# Patient Record
Sex: Female | Born: 1995 | Race: White | Hispanic: No | Marital: Single | Smoking: Never smoker
Health system: Southern US, Academic
[De-identification: ages and names within clinical notes are randomized; demographics above are authoritative.]

## PROBLEM LIST (undated history)

## (undated) DIAGNOSIS — A64 Unspecified sexually transmitted disease: Secondary | ICD-10-CM

## (undated) DIAGNOSIS — G47419 Narcolepsy without cataplexy: Secondary | ICD-10-CM

## (undated) HISTORY — DX: Narcolepsy without cataplexy: G47.419

## (undated) HISTORY — PX: HX WISDOM TEETH EXTRACTION: SHX21

## (undated) HISTORY — PX: HX NO SURGICAL PROCEDURES: 2100001501

## (undated) HISTORY — DX: Unspecified sexually transmitted disease: A64

---
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
## 2017-05-22 ENCOUNTER — Ambulatory Visit (INDEPENDENT_AMBULATORY_CARE_PROVIDER_SITE_OTHER): Payer: Self-pay | Admitting: Family

## 2017-09-24 ENCOUNTER — Ambulatory Visit (INDEPENDENT_AMBULATORY_CARE_PROVIDER_SITE_OTHER): Payer: BC Managed Care – PPO

## 2017-09-24 ENCOUNTER — Encounter (FREE_STANDING_LABORATORY_FACILITY)
Admit: 2017-09-24 | Discharge: 2017-09-24 | Disposition: A | Discharge status: Home / Self Care | Payer: BC Managed Care – PPO | Attending: Emergency Medicine | Admitting: Emergency Medicine

## 2017-09-24 ENCOUNTER — Encounter (INDEPENDENT_AMBULATORY_CARE_PROVIDER_SITE_OTHER): Payer: Self-pay

## 2017-09-24 VITALS — BP 121/76 | HR 79 | Temp 98.5°F | Resp 16 | Ht 62.0 in | Wt 125.0 lb

## 2017-09-24 DIAGNOSIS — Z113 Encounter for screening for infections with a predominantly sexual mode of transmission: Secondary | ICD-10-CM | POA: Insufficient documentation

## 2017-09-24 DIAGNOSIS — Z3202 Encounter for pregnancy test, result negative: Secondary | ICD-10-CM

## 2017-09-24 DIAGNOSIS — R198 Other specified symptoms and signs involving the digestive system and abdomen: Principal | ICD-10-CM

## 2017-09-24 DIAGNOSIS — R109 Unspecified abdominal pain: Secondary | ICD-10-CM

## 2017-09-24 DIAGNOSIS — Z6822 Body mass index (BMI) 22.0-22.9, adult: Secondary | ICD-10-CM

## 2017-09-24 NOTE — Patient Instructions (Addendum)
[place]     Operated by [facility]  [place], [place] [address]  Phone: [phone]  Fax: [phone]  [email or web address]  Twitter [email or web address]   Closed all  Holidays        Attending Caregiver: [facility]    Today's orders:   Orders Placed This Encounter   . LACTOFERRIN   . NEISSERIA GONORRHOEAE DNA BY PCR   . CHLAMYDIA TRACHOMITIS DNA BY PCR (INHOUSE)   . CALPROTECTIN, FECES   . POCT URINE PREGNANCY         Prescription(s) E-Rx to:  [id number] [place], [place]    Future appts with [facility]  Visit date not found    [place]. THIS IS ONE EASY WAY TO COMMUNICATE WITH [facility].  SEE THE CODE ON THIS FORM FOR ACCESS.    -----------------------------PRIVACY INFORMATION-----------------------------------------------  As a [place] student, regardless of your age, we cannot discuss your personal health information with a parent, spouse, family member or anyone else without your expressed consent.    This policy does not include:  Individuals who would have a legitimate reason to access your records and information to assist in your care under the provisions of HIPAA ([place] and [place]) law;   Individuals with whom you have previously given expressed written consent to do so, such a legal guardian or Power of Attorney.    No one can access your My[facility], unless you give them your account sign on and password. You will receive emails from [place].  This means anyone who has access to the email account you provided can see this notification. There will be no private medical information included in these emails. This notification of  new medical information  available in your My[facility]Chart, may be information that you do not want others to know.   _______________________________________________________________________    If your  symptoms persist, worsen or you develop any new or concerning symptoms please call [place] for at [phone] for a follow up appointment.   If your symptoms are severe, go immediately to the emergency department or call 911.  Please check with your health insurance coverage for these types of visits.   Please keep all follow up visit recommended at today's visit.    If you received x-rays during your visit, be aware that the final and formal interpretation of those films by a radiologist will occur after your discharge.  If there is a significant discrepancy identified after your discharge, we will contact you at the telephone number provided during registration.  These results are available for your review on [facility].    Please refer to your My[facility] for lab test results. Lab test results related to HIV, STI's and pregnancy are considered private and are therefore not immediately visible in your My[facility]Chart, we may still be able to send a generic MyChart message for these results.  If you have cultures pending for sexually transmitted diseases, you will be contacted by phone.     Positive cultures are reported to the [place] Department of Health, as required by state law. These results are considered private on My[facility] and can only be released by the provider.We will not contact you if the results are negative.    It is very important that we have a phone number that is the single best way to contact you in the event that we become aware of important clinical  information or concerns after your discharge.  If the phone number you provided at registration is NOT this number you should inform staff and registration prior to leaving.     Please call [place] at [phone] with any further questions.    Instructions discussed with patient upon discharge by clinical staff with all questions answered.    Starlyn Skeans, MD 09/24/2017, 18:13        What is Irritable Bowel Syndrome (IBS)?      Muscle contraction moves food through the digestive tract.      People who have irritable bowel syndrome (IBS) have digestive tracts that react abnormally to certain substances or to stress. This leads to symptoms like cramps, gas, bloating, pain, constipation, and diarrhea. Sometimes called "spastic colon," IBS is a common condition that is not a disease, but rather a group of symptoms that happen together.  IBS--a motility problem  The muscle movement that passes food through the digestive tract is called motility. When you have IBS, the normal motility of the digestive tract (especially the colon) is disrupted. Motility may speed up, slow down, or become irregular. If stool passes too quickly through the colon, not enough water is absorbed from it. Loose, watery stools (diarrhea) can result. If stool passes through the colon too slowly, too much water is absorbed and the stool becomes hard and dry (constipation). Also, stool and gas may back up and cause painful pressure and cramping. There is no single test that can diagnose IBS. It is a group of symptoms that help your healthcare provider with the diagnosis. Often multiple blood, stool, radiologic tests, or even colonoscopy are performed in the evaluation of people suspected to have IBS. These are done primarily to make sure that there are no other illnesses that can account for your symptoms.  What causes IBS?  A great deal of research has been done on IBS, but the cause is still not known. Some of the possible factors include:   Smoking, eating certain foods, or drinking alcohol or caffeinated drinks can cause, or "trigger," symptoms of IBS.   Although no one knows for sure, IBS may be caused by a problem with the nerves or muscles in your digestive tract.   There is also some evidence that certain bacteria found after a severe gastroinstestinal infectionin the small intestine and colon may cause IBS.   While stress and anxiety worsen the symptoms of  IBS, it is not believed to be the cause.  What you can do  Recommendations include:   Certain medicines may help regulate the working of your digestive tract. Your healthcare provider may prescribe one or more for you.   Medicine can't cure IBS, butit may help manage the symptoms.   Because some medicines may make IBS worse, don't take any medicine, especially laxatives, unless your healthcare provider prescribes it for you.   Your healthcare provider may suggest some lifestyle changes to help control your IBS. Two of the most important are changing your diet and managing stress. If diet changes are suggested, ask for nutritional guidance from a dietitian so you maintain a healthy nutritional balance in your food intake.  Date Last Reviewed: 03/23/2015   2000-2018 [employer]. [address] [place] [address]. All rights reserved. This information is not intended as a substitute for professional medical care. Always follow your healthcare professional's instructions.

## 2017-09-24 NOTE — Progress Notes (Signed)
History of Present Illness: Barbara Graves is a 22 y.o. female who presents to the [facility] today with chief complaint of    Chief Complaint            Nausea After eating.  Mucous in stool.  Symptoms for 4-5 months.  Has diarrhea and constipation at times.         Pt [facility] to [facility] with c/o nausea onset 4-5 months. She reports that she has had random episodes of diarrhea. Pt sts she has had irregular frequency of bowel movements. She reports her nausea is present after eating. Pt sts she has the sensation of vomiting when she is nauseated. She says she has occasional bloating and describes her bowels as "gurgly". Pt reports she has not taken any new medications. She sts she has no Hx of inflammatory bowel diseases. Pt reports she has been unable to eat a lot. She sts she has not had a major change in her diet recently and says she mainly eats pizza and carbohydrates. Pt reports she gets frequent tension headaches. She associates mucus in stool, diarrhea, and bloating. Pt denies abdominal pain and cramps.    Pt also presents with c/o request for STI screening. She reports she is asymptomatic.    Patient denies   new onset of dysuria and genital lesions and vaginal discharge  Patient denies  exposure to STI  Known exposure to no known diseases.   Patient has history of  no prior STI's..   Last sexual contact 2Weeks ago with 1 partners.  Was this contact consensual? yes   Contact protected: no   Condom use: 100%-75%of the time.   Sexual partners in last 60 days: 1  Lifetime sexual partners: Less than 10  Gender of current/past partners Female.  Contraception method: oral contraceptives     Functional Health Screening:    Patient is under 18:  No  Have you had a recent unexplained weight loss or gain?:  No  Because we are aware of abuse and domestic violence today, we ask all patients: Are you being hurt, hit, or frightened by anyone at your home or in your life?:  No  Do you have any basic needs  within your home that are not being met? (such as Food, Shelter, Games developer, Transportation):  No  Patient is under 18 and therefore has no Advance Directives:  No  Patient has:  No Advance  Patient has Advance Directive:  No  Patient offered:  Refused Packet       I reviewed and confirmed the patient's past medical history taken by the nurse or medical assistant with the addition of the following:    Past Medical History:    History reviewed. No pertinent past medical history.    Past Surgical History:    History reviewed. No past surgical history pertinent negatives.    Allergies:  Allergies no known allergies     Medications:    Current Outpatient Medications   Medication Sig   . oral contraceptive (PATIENT'S OWN SUPPLY) Take 1 Tab by mouth Once a day     Social History:    Social History     Tobacco Use   . Smoking status: Never Smoker   . Smokeless tobacco: Never Used   Substance Use Topics   . Alcohol use: Never     Frequency: Never   . Drug use: Never     Family History: No significant family history.  Family Medical History:  Problem Relation (Age of Onset)    No Known Problems Mother, Father, Maternal Grandmother, Maternal Grandfather, Paternal Grandmother, Paternal Grandfather        Review of Systems:    Gastrointestinal: Nausea, mucus in stool, diarrhea, bloating, no abdominal pain, and no cramps  All other review of systems were negative    Physical Exam:  Vital signs:   Vitals:    09/24/17 1756   BP: 121/76   Pulse: 79   Resp: 16   Temp: 36.9 C (98.5 F)   TempSrc: Thermal Scan   SpO2: 100%   Weight: 56.7 kg (125 lb)   Height: 1.575 m ('5\' 2"' )   BMI: 22.91     Facility age limit for growth percentiles is 20 years.    Body mass index is 22.86 kg/m. Facility age limit for growth percentiles is 20 years.  Patient's last menstrual period was 09/03/2017 (approximate).    General:  Well appearing and no acute distress  Head:  Normocephalic  Eyes:  Normal lids/lashes, normal conjunctiva and PERRL  ENT:   Normal EAC's, normal TM's, MMM, normal pharynx/tonsils, normal tongue/uvula and no sinus tenderness to palpation  Neck:  Supple  Pulmonary:  Clear to auscultation bilaterally, no wheezes, no rales and no rhonchi  Cardiovascular:  Regular rate/rhythm, normal S1/S2, no murmur/rub/gallop and distal pulses of bilateral upper extremities intact  Gastrointestinal:  Non-distended, soft, non-tender and no guarding  Skin:  Warm/dry and no rash  Psychiatric:  Appropriate affect and behavior  Neurologic:   Alert and oriented x 3    Data Reviewed:    Point-of-care testing:     Urine Pregnancy: Negative                   and Labs: Gonorrhea and chlamydia screening and lactoferrin and calprotectin testing    Course: Condition at discharge: Good     Differential Diagnosis: IBS vs IBD vs STI screening    Assessment:   1. Alternating constipation and diarrhea    2. Abdominal discomfort    3. Routine screening for STI (sexually transmitted infection)      Plan:    Orders Placed This Encounter   . LACTOFERRIN   . NEISSERIA GONORRHOEAE DNA BY PCR   . CHLAMYDIA TRACHOMITIS DNA BY PCR (INHOUSE)   . CALPROTECTIN, FECES   . 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  Pending lactoferrin and calprotectin testing, if negative then inflammatory bowel ruled out.  Advised to stay hydrated, increase fiber intake, and improving diet.  Advised food diary.  Patient requests STI screening for gonorrhea and chlamydia.    Go to Emergency Department immediately for further work up if any concerning symptoms.  Plan was discussed and patient verbalized understanding.  If symptoms are worsening or not improving the patient should return to the [facility] for further evaluation.    I am scribing for, and in the presence of, Dr. Malena Catholic for services provided on 09/24/2017.  Celene Squibb, SCRIBE   [place], [place] 09/24/2017, 18:00    I personally performed the services described in this documentation, as scribed  in my  presence, and it is both accurate  and complete.    Starlyn Skeans, MD

## 2017-09-25 ENCOUNTER — Encounter (FREE_STANDING_LABORATORY_FACILITY)
Admit: 2017-09-25 | Discharge: 2017-09-25 | Disposition: A | Discharge status: Home / Self Care | Payer: BC Managed Care – PPO | Attending: Emergency Medicine | Admitting: Emergency Medicine

## 2017-09-25 DIAGNOSIS — R109 Unspecified abdominal pain: Secondary | ICD-10-CM | POA: Insufficient documentation

## 2017-09-25 DIAGNOSIS — R198 Other specified symptoms and signs involving the digestive system and abdomen: Secondary | ICD-10-CM | POA: Insufficient documentation

## 2017-09-25 LAB — CHLAMYDIA TRACHOMITIS DNA BY PCR (INHOUSE): CHLAMYDIA TRACHOMATIS PCR: NOT DETECTED

## 2017-09-25 LAB — NEISSERIA GONORRHOEAE DNA BY PCR: NEISSERIA GONORRHOEAE PCR: NOT DETECTED

## 2017-09-28 LAB — LACTOFERRIN: LACTOFERRIN: NEGATIVE

## 2017-09-29 LAB — CALPROTECTIN, FECES
CALPROTECTIN, FECES: <15.6 mcg/g
CALPROTECTIN, FECES: <15.6 mcg/g

## 2017-10-08 ENCOUNTER — Encounter (INDEPENDENT_AMBULATORY_CARE_PROVIDER_SITE_OTHER): Payer: Self-pay | Admitting: Family Medicine

## 2017-11-26 ENCOUNTER — Encounter (INDEPENDENT_AMBULATORY_CARE_PROVIDER_SITE_OTHER): Payer: Self-pay

## 2017-11-26 ENCOUNTER — Ambulatory Visit (INDEPENDENT_AMBULATORY_CARE_PROVIDER_SITE_OTHER): Payer: BC Managed Care – PPO

## 2017-11-26 VITALS — BP 125/90 | HR 100 | Temp 98.4°F | Resp 18 | Ht 62.0 in | Wt 122.0 lb

## 2017-11-26 DIAGNOSIS — R05 Cough: Secondary | ICD-10-CM

## 2017-11-26 DIAGNOSIS — J329 Chronic sinusitis, unspecified: Principal | ICD-10-CM

## 2017-11-26 DIAGNOSIS — R059 Cough, unspecified: Secondary | ICD-10-CM

## 2017-11-26 DIAGNOSIS — Z6822 Body mass index (BMI) 22.0-22.9, adult: Secondary | ICD-10-CM

## 2017-11-26 MED ORDER — AMOXICILLIN 875 MG-POTASSIUM CLAVULANATE 125 MG TABLET: 1 | Tab | Freq: Two times a day (BID) | ORAL | 0 refills | 0 days | Status: AC

## 2017-11-26 NOTE — Patient Instructions (Signed)
Sinusitis (Antibiotic Treatment)    The sinuses are air-filled spaces within the bones of the face. They connect to the inside of the nose.Sinusitisis an inflammation of the tissue that lines the sinuses. Sinusitis can occur during a cold. It can also happen due to allergies to pollens and other particles in the air. Sinusitis can cause symptoms of sinus congestion and a feeling of fullness. A sinus infection causes fever, headache, and facial pain. There is often green or yellow fluid draining from the nose or into the back of the throat (post-nasal drip). You have been given antibiotics to treat this condition.  Home care   Take the full course of antibiotics as instructed. Do not stop taking them, even when you feel better.   Drink plenty of water, hot tea, and other liquids. This may help thin nasal mucus. It also may help your sinuses drain fluids.   Heat may help soothe painful areas of your face. Use a towel soaked in hot water. Or, stand in the shower and direct the warm spray onto your face. Using a vaporizer along with a menthol rub at night may also help soothe symptoms.   Anexpectorantwith guaifenesin may help thin nasal mucus and help your sinuses drain fluids.   You can use an over-the-counterdecongestant,unless a similar medicine was prescribed to you. Nasal sprays work the fastest. Use one that contains phenylephrine or oxymetazoline. First blow your nose gently. Then use the spray. Do not use these medicines more often than directed on the label. If you do, your symptoms may get worse. You may also take pills that contain pseudoephedrine. Don't use products that combine multiple medicines. This is because side effects may be increased. Read labels. You can also ask the pharmacist for help. (People with high blood pressure should not use decongestants. They can raise blood pressure.)   Over-the-counterantihistaminesmay help if allergies contributed to your sinusitis.    Do not use  nasal rinses or irrigation during an acute sinus infection, unless your healthcare provider tells you to. Rinsing may spread the infection to other areas in your sinuses.   Use acetaminophen or ibuprofen to control pain, unless another pain medicine was prescribed to you. If you have chronic liver or kidney disease or ever had a stomach ulcer, talk with your healthcare provider before using these medicines. (Aspirin should never be taken by anyone under age 18 who is ill with a fever. It may cause severe liver damage.)   Don't smoke. This can make symptoms worse.  Follow-up care  Follow up with your healthcare provider or our staff if you are not better in 1 week.  When to seek medical advice  Call your healthcare provider if any of these occur:   Facial pain or headache that gets worse   Stiff neck   Unusual drowsiness or confusion   Swelling of your forehead or eyelids   Vision problems, such as blurred or double vision   Fever of100.4F (38C)or higher, or as directed by your healthcare provider   Seizure   Breathing problems   Symptoms don't go away in 10 days  Prevention  Here are steps you can take to help prevent an infection:   Keep good hand washing habits.   Don't have close contact with people who have sore throats, colds, or other upper respiratory infections.   Don't smoke, and stay away from secondhand smoke.   Stay up to date with of your vaccines.  Date Last Reviewed: 07/23/2016     2000-2018 [employer]. [address], [place] [address]. All rights reserved. This information is not intended as a substitute for professional medical care. Always follow your healthcare professional's instructions.      [place]     Operated by [facility]  [place], [place] [address]  Phone: [phone]  Fax: [phone]  [email or web address]  Twitter [email or web address]   Closed all  Holidays        Attending  Caregiver: [facility]    Today's orders: No orders of the defined types were placed in this encounter.        Prescription(s) E-Rx to:  [id number] [place], [place]    Future appts with [facility]  Visit date not found    [place]. THIS IS ONE EASY WAY TO COMMUNICATE WITH [facility].  SEE THE CODE ON THIS FORM FOR ACCESS.    -----------------------------PRIVACY INFORMATION-----------------------------------------------  As a [place] student, regardless of your age, we cannot discuss your personal health information with a parent, spouse, family member or anyone else without your expressed consent.    This policy does not include:  Individuals who would have a legitimate reason to access your records and information to assist in your care under the provisions of HIPAA ([place] and [place]) law;   Individuals with whom you have previously given expressed written consent to do so, such a legal guardian or Power of Attorney.    No one can access your My[facility]Chart, unless you give them your account sign on and password. You will receive emails from [place].  This means anyone who has access to the email account you provided can see this notification. There will be no private medical information included in these emails. This notification of  new medical information  available in your My[facility]Chart, may be information that you do not want others to know.   _______________________________________________________________________    If your symptoms persist, worsen or you develop any new or concerning symptoms please call [place] for at [phone] for a follow up appointment.   If your symptoms are severe, go immediately to the emergency department or call 911.  Please check with your health insurance coverage for these types of visits.   Please keep all follow up visit recommended at today's visit.    If you  received x-rays during your visit, be aware that the final and formal interpretation of those films by a radiologist will occur after your discharge.  If there is a significant discrepancy identified after your discharge, we will contact you at the telephone number provided during registration.  These results are available for your review on My[facility].    Please refer to your My[facility]Chart for lab test results. Lab test results related to HIV, STI's and pregnancy are considered private and are therefore not immediately visible in your My[facility]Chart, we may still be able to send a generic MyChart message for these results.  If you have cultures pending for sexually transmitted diseases, you will be contacted by phone.     Positive cultures are reported to the [place] Department of Health, as required by state law. These results are considered private on My[facility]Chart and can only be released by the provider.We will not contact you if the results are negative.    It is very important that we have a phone number that is the single best way to contact  you in the event that we become aware of important clinical information or concerns after your discharge.  If the phone number you provided at registration is NOT this number you should inform staff and registration prior to leaving.     Please call [place] at [phone] with any further questions.    Instructions discussed with patient upon discharge by clinical staff with all questions answered.    Nyna Chilton, PA-C 11/26/2017, 17:24

## 2017-11-26 NOTE — Progress Notes (Addendum)
Attending physician: Dr. Malena Catholic  History of Present Illness: Barbara Graves is a 22 y.o. female who presents to the [facility] today with chief complaint of    Chief Complaint            Sinus Infection x1 week    Chest Congestion         Pt presents to [facility] with c/o productive and dry cough onset 2 weeks ago. She reports that her sxs started as cold sxs and she took OTC sudafed which alleviated her sxs but then about 1 week ago her sxs worsened again. Pt sts that she is currently on birth control. She says she has chest pain that is mainly present after coughing but is occasionally sharp. Pt reports that she has no known drug allergies. She associates sinus congestion, chest tightness, fever, fatigue, sinus pressure, and sneezing.    Functional Health Screening:    Patient is under 18:  No  Have you had a recent unexplained weight loss or gain?:  No  Because we are aware of abuse and domestic violence today, we ask all patients: Are you being hurt, hit, or frightened by anyone at your home or in your life?:  No  Do you have any basic needs within your home that are not being met? (such as Food, Shelter, Games developer, Transportation):  No  Patient is under 18 and therefore has no Advance Directives:  No  Patient has:  No Advance  Patient has Advance Directive:  No  Patient offered:  Refused Packet       I reviewed and confirmed the patient's past medical history taken by the nurse or medical assistant with the addition of the following:    Past Medical History:    History reviewed. No pertinent past medical history.    Past Surgical History:    Past Surgical History:   Procedure Laterality Date   . HX NO SURGICAL PROCEDURES       Allergies:  No Known Allergies     Medications:    Current Outpatient Medications   Medication Sig   . amoxicillin-pot clavulanate (AUGMENTIN) 875-125 mg Oral Tablet Take 1 Tab by mouth Twice daily for 7 days   . oral contraceptive (PATIENT'S OWN SUPPLY) Take 1 Tab by  mouth Once a day     Social History:    Social History     Tobacco Use   . Smoking status: Never Smoker   . Smokeless tobacco: Never Used   Substance Use Topics   . Alcohol use: Never     Frequency: Never   . Drug use: Never     Family History: No significant family history.  Family Medical History:     Problem Relation (Age of Onset)    No Known Problems Mother, Father, Maternal Grandmother, Maternal Grandfather, Paternal Grandmother, Paternal Grandfather        Review of Systems:    General: Fever and fatigue  ENT: Sinus congestion, sinus congestion, and sneezing  Pulmonary: Productive and dry cough and chest congestion  All other review of systems were negative    Physical Exam:  Vital signs:   Vitals:    11/26/17 1711   BP: 125/90   Pulse: 100   Resp: 18   Temp: 36.9 C (98.4 F)   TempSrc: Thermal Scan   SpO2: 98%   Weight: 55.3 kg (122 lb)   Height: 1.575 m ('5\' 2"' )   BMI: 22.36     Facility  age limit for growth percentiles is 20 years.    Body mass index is 22.31 kg/m. Facility age limit for growth percentiles is 20 years.  Patient's last menstrual period was 11/16/2017 (approximate).    General:  Well appearing and no acute distress  Head:  Normocephalic  Eyes:  Normal lids/lashes and normal conjunctiva  ENT:  Normal EAC's, normal TM's, MMM, normal pharynx/tonsils and normal tongue/uvula, TTP of the frontal and maxillary sinuses bilaterally, TTP of the anterior chest wall and ribs  Neck:  Supple  Pulmonary:  Clear to auscultation bilaterally, no wheezes, no rales and no rhonchi  Cardiovascular:  Regular rate/rhythm, normal S1/S2, and no murmur/rub/gallop  Skin:  Warm/dry and no rash  Psychiatric:  Appropriate affect and behavior  Neurologic:  Alert and oriented x 3  Hem/Lymph:  No cervical lymphadenopathy     Data Reviewed:    Not applicable    Course: Condition at discharge: Good     Differential Diagnosis: Sinusitis vs Bronchitis vs URI    Assessment:   1. Sinusitis, unspecified chronicity, unspecified  location    2. Cough      Plan:    Orders Placed This Encounter   . amoxicillin-pot clavulanate (AUGMENTIN) 875-125 mg Oral Tablet     Take Augmentin as prescribed.  Advised to take OTC Sudafed and Flonase for additional symptomatic relief.  Follow up if chest pain, SOB, or fever worsens for reevaluation.    Go to Emergency Department immediately for further work up if any concerning symptoms.  Plan was discussed and patient verbalized understanding. If symptoms are worsening or not improving the patient should return to the [facility] for further evaluation.    I am scribing for, and in the presence of, Kirstin Humphrey, PA-C, for services provided on 11/26/2017.  Celene Squibb, [place], [place] 11/26/2017, 17:57     The supervising physician was physically present and available for consultation, and did not physically see the patient.  I personally performed the services described in this documentation, as scribed  in my presence, and it is both accurate  and complete.    Kirstin Humphrey, [employer]

## 2018-03-08 ENCOUNTER — Ambulatory Visit (INDEPENDENT_AMBULATORY_CARE_PROVIDER_SITE_OTHER): Payer: BC Managed Care – PPO

## 2018-03-08 ENCOUNTER — Encounter (INDEPENDENT_AMBULATORY_CARE_PROVIDER_SITE_OTHER): Payer: Self-pay

## 2018-03-08 VITALS — BP 137/72 | HR 80 | Temp 97.8°F | Resp 14 | Ht 62.0 in | Wt 119.5 lb

## 2018-03-08 DIAGNOSIS — J029 Acute pharyngitis, unspecified: Secondary | ICD-10-CM

## 2018-03-08 DIAGNOSIS — H6593 Unspecified nonsuppurative otitis media, bilateral: Secondary | ICD-10-CM

## 2018-03-08 DIAGNOSIS — Z6821 Body mass index (BMI) 21.0-21.9, adult: Secondary | ICD-10-CM

## 2018-03-08 MED ORDER — PREDNISONE 5 MG TABLET: Tab | ORAL | 0 refills | 0 days | Status: DC

## 2018-03-08 MED ORDER — AZELASTINE 137 MCG (0.1 %) NASAL SPRAY AEROSOL
1.00 | INHALATION_SPRAY | Freq: Two times a day (BID) | NASAL | 0 refills | Status: AC | PRN
Start: 2018-03-08 — End: 2018-03-22

## 2018-03-08 NOTE — Patient Instructions (Addendum)
[facility]      Operated by [facility]  [address] [place], [place] [address]  Phone: [phone])  Fax: [phone]  [email or web address]  Open Daily 8:00a - 8:00p    Closed Thanksgiving and Christmas Day  [facility]     Operated by [facility]  [place], [place] [address]  Phone: [phone])  Fax: [phone]  [email or web address]  Open M-F  8:00a - 8:00p  Sat              10:00a - 4:00p  Sun             Closed  Closed all [place] Holidays        Attending Caregiver: Aura Fey, MD      Today's orders:   Orders Placed This Encounter   . POCT RAPID STREP A   . predniSONE (DELTASONE) 5 mg Oral Tablet   . azelastine (ASTELIN) 137 mcg (0.1 %) Nasal Aerosol, Spray        Prescription(s) E-Rx to:  [facility] [place], [address]    ________________________________________________________________________  Short Term Disability and [facility] does NOT provide assistance with any disability applications.  If you feel your medical condition requires you to be on disability, you will need to follow up with  your primary care physician or a specialist.  We apologize for any inconvenience.    For Medication Prescribed by [facility]:  As an Urgent Care facility, our clinic does NOT offer prescription refills over the telephone.    If you need more of the medication one of our medical providers prescribed, you will  either need to be re-evaluated by [place] or see your primary care physician.    ________________________________________________________________________      It is very important that we have a phone number.  This is the single best way to contact you in the event that we become aware of important clinical information or concerns after your discharge.  If the phone number you provided at  registration is NOT this number you should inform staff and registration prior to leaving.      Your treatment and evaluation today was focused on identifying and treating potentially emergent conditions based on your presenting signs, symptoms, and history.  The resulting initial clinical impression and treatment plan is not intended to be definitive or a substitute for a full physical examination and evaluation by your primary care provider.  If your symptoms persist, worsen, or you develop any new or concerning symptoms, you need to be evaluated.      If you received x-rays during your visit, be aware that the final and formal interpretation of those films by a radiologist may occur after your discharge.  If there is a significant discrepancy identified after your discharge, we will contact you at the telephone number provided at registration.      If you received a pelvic exam, you may have cultures pending for sexually transmitted diseases.  Positive cultures are reported to the [place] Department of Health as required by state law.  You may contact the Health Information Management Office of [facility] to get a copy of your results.     If you are over 45 year old, we cannot discuss your personal health information with a parent, spouse, family member, or  anyone else without your consent.  This does not include those who have legitimate access to your records and information to assist in your care under the provisions of HIPAA ([place]) law, or those to whom you have previously given written consent to do so, such a legal guardian or Power of [place].      Instructions are discussed with patient upon discharge by clinical staff with all questions answered.  Please call [facility] [phone]) if any further questions develop.  Go immediately to the emergency department if any concerns or worsening symptoms.      Aura Fey, MD 03/08/2018, 17:07          Viral Upper Respiratory Illness (Adult)    You have a viral upper respiratory illness (URI), which is another term for the common cold. This illness is contagious during the first few days. It is spread through the air by coughing and sneezing. It may also be spread by direct contact (touching the sick person and then touching your own eyes, nose, or mouth). Frequent handwashing will decrease risk of spread. Most viral illnesses go away within 7 to 10 days with rest and simple home remedies. Sometimes the illness may last for several weeks. Antibiotics will not kill a virus, and they are generally not prescribed for this condition.  Home care   If symptoms are severe, rest at home for the first 2 to 3 days. When you resume activity, don't let yourself get too tired.   Don't smoke. If you need help stopping, talk with your healthcare provider.   Avoid being exposed to cigarette smoke (yours or others').   You may use acetaminophen or ibuprofen to control pain and fever, unless another medicine was prescribed.If you have chronic liver or kidney disease, have ever had a stomach ulcer or gastrointestinal bleeding, or are taking blood-thinning medicines, talk with your healthcare provider before using these medicines. Aspirin should never be given to anyone under 62 years of age who is ill with a viral infection or fever. It may cause severe liver or brain damage.   Your appetite may be poor, so a light diet is fine. Stay well hydrated by drinking 6 to 8 glasses of fluids per day (water, soft drinks, juices, tea, or soup). Extra fluids will help loosen secretions in the nose and lungs.   Over-the-counter cold medicines will not shorten the length of time you're sick, but they may be helpful for the following symptoms: cough, sore throat, and nasal and sinus congestion. If you take prescription medicines, ask your healthcare provider or pharmacist which over-the-counter medicines are safe to use. (Note: Don't  use decongestants if you have high blood pressure.)  Follow-up care  Follow up with your healthcare provider, or as advised.  When to seek medical advice  Call your healthcare provider right away if any of these occur:   Cough with lots of colored sputum (mucus)   Severe headache; face, neck, or ear pain   Difficultyswallowingdue to throat pain   Fever of 100.26F (38C) or higher, or as directed by your healthcare provider  Call 911  Call 911 if any of these occur:   Chest pain, shortness of breath, wheezing, or difficulty breathing   Coughing up blood   Very severe pain with swallowing, especially if it goes along with a muffled voice   Date Last Reviewed: 02/20/2017   2000-2019 [employer]. [address] [place] [address].  All rights reserved. This information is not intended as a substitute for professional medical care. Always follow your healthcare professional's instructions.

## 2018-03-08 NOTE — Progress Notes (Signed)
History of Present Illness: Barbara Graves is a 22 y.o. female who presents to the [facility] today with chief complaint of    Chief Complaint            Ear Ache bilateral    Sore Throat         [facility] with c/o sore throat onset yesterday. She sts that for the past month she has been experiencing bilateral otalgia intermittently over the past month. Pt reports that she became concerned this morning when she woke up and noticed blood in her L ear canal. She sts that she is otherwise healthy with no concerning medical history. Pt reports that she is not a smoker. She sts that she just recently ended her period and has no concerns for pregnancy. Pt reports that she does take birth control. She associates nasal congestion, bilateral otalgia, dry cough, SOB, wheezing and swollen lymph glands. Pt denies fever, rhinorrhea, sneezing and itchy/watery eyes.    On average, how many days/week do you engage in moderate to vigorous physical activity (like brisk walking)?: 5 Days  On average, how many minutes do you enage in physical activity at this level?: 30-45 Minutes/day  Total activity days/week x minutes/day =: 225    Functional Health Screening:    Patient is under 18:  No  Have you had a recent unexplained weight loss or gain?:  No  Because we are aware of abuse and domestic violence today, we ask all patients: Are you being hurt, hit, or frightened by anyone at your home or in your life?:  No  Do you have any basic needs within your home that are not being met? (such as Food, Shelter, Games developer, Transportation):  No  Patient is under 18 and therefore has no Advance Directives:  No  Patient has:  No Advance  Patient has Advance Directive:  No  Patient offered:  Refused Packet  Screening unable to be completed:  No       I reviewed and confirmed the patient's past medical history taken by the nurse or medical assistant with the addition of the following:    Past Medical History:    History  reviewed. No pertinent past medical history.    Past Surgical History:    Past Surgical History:   Procedure Laterality Date   . HX NO SURGICAL PROCEDURES       Allergies:  No Known Allergies     Medications:    Current Outpatient Medications   Medication Sig   . azelastine (ASTELIN) 137 mcg (0.1 %) Nasal Aerosol, Spray 1 Spray by INTRANASAL route Twice per day as needed for Rhinitis for up to 14 days Use in each nostril as directed   . oral contraceptive (PATIENT'S OWN SUPPLY) Take 1 Tab by mouth Once a day   . predniSONE (DELTASONE) 5 mg Oral Tablet 4 tabs po x 1 day then 3 tabs po x 1 day then 2 tabs po x 1 day then 1 tab po x 1 day then stop     Social History:    Social History     Tobacco Use   . Smoking status: Never Smoker   . Smokeless tobacco: Never Used   Substance Use Topics   . Alcohol use: Never     Frequency: Never   . Drug use: Never     Family History: No significant family history.  Family Medical History:     Problem Relation (Age of  Onset)    No Known Problems Mother, Father, Maternal Grandmother, Maternal Grandfather, Paternal Grandmother, Paternal Grandfather        Review of Systems:    General: no fever  ENT:  sore throat, bilateral otalgia, nasal congestion, no rhinorrhea and no sneezing  Eyes: no itchy/watery eyes  Pulmonary:   dry cough, wheezing and SOB  Heme/Lymph:  swollen lymph glands  All other review of systems were negative    Physical Exam:  Vital signs:   Vitals:    03/08/18 1645   BP: 137/72   Pulse: 80   Resp: 14   Temp: 36.6 C (97.8 F)   TempSrc: Thermal Scan   SpO2: 98%   Weight: 54.2 kg (119 lb 8 oz)   Height: 1.575 m ('5\' 2"' )   BMI: 21.9     Facility age limit for growth percentiles is 20 years.    Body mass index is 21.86 kg/m. Facility age limit for growth percentiles is 20 years.  Patient's last menstrual period was 02/22/2018.    General:  Well appearing and no acute distress  Head:  Normocephalic  Eyes:  Normal lids/lashes and normal conjunctiva  ENT:  Normal EAC's,  TM's are dull with a small amount of fluid present bilaterally, MMM, mildly erythematous pharynx with cobblestoning appearance and normal tongue/uvula  Neck:  Supple and no thyromegaly  Pulmonary:  Clear to auscultation bilaterally, no wheezes, no rales and no rhonchi  Cardiovascular:  Regular rate/rhythm, normal S1/S2 and no murmur/rub/gallop  Skin:  Warm/dry and no rash  Psychiatric:  Appropriate affect and behavior  Neurologic:   Alert and oriented x 3  Hem/Lymph:  No cervical lymphadenopathy     Data Reviewed:    Point-of-care testing:       Rapid Strep: Negative                      Course: Condition at discharge: Good     Differential Diagnosis: serous otitis vs allergies vs strep pharyngitis     Assessment:   1. Sorethroat    2. Bilateral serous otitis media      Plan:    Orders Placed This Encounter   . POCT RAPID STREP A   . predniSONE (DELTASONE) 5 mg Oral Tablet   . azelastine (ASTELIN) 137 mcg (0.1 %) Nasal Aerosol, Spray         POCT rapid strep obtained in clinic which yielded negative results, discussed this with patient.   Take Prednisone and use Astelin as directed.  Ensure adequate hydration and rest.  Discussed symptomatic management with Tylenol and Ibuprofen.  Supplied excuse for work per patient request.     Go to Emergency Department immediately for further work up if any concerning symptoms.  Plan was discussed and patient verbalized understanding. If symptoms are worsening or not improving the patient should return to the [facility] for further evaluation.    I am scribing for, and in the presence of, Dr. Harlow Mares for services provided on 03/08/2018.  Pleas Patricia, SCRIBE     Adam Gorden Harms, SCRIBE 03/08/2018, 17:01  I personally performed the services described in this documentation, as scribed  in my presence, and it is both accurate  and complete.    Aura Fey, MD

## 2018-03-17 ENCOUNTER — Encounter (INDEPENDENT_AMBULATORY_CARE_PROVIDER_SITE_OTHER): Payer: Self-pay

## 2018-03-17 ENCOUNTER — Ambulatory Visit (INDEPENDENT_AMBULATORY_CARE_PROVIDER_SITE_OTHER): Payer: BC Managed Care – PPO

## 2018-03-17 VITALS — BP 122/72 | HR 57 | Temp 97.6°F | Resp 18 | Ht 62.4 in | Wt 117.9 lb

## 2018-03-17 DIAGNOSIS — Z6821 Body mass index (BMI) 21.0-21.9, adult: Secondary | ICD-10-CM

## 2018-03-17 DIAGNOSIS — H6590 Unspecified nonsuppurative otitis media, unspecified ear: Secondary | ICD-10-CM

## 2018-03-17 DIAGNOSIS — H6593 Unspecified nonsuppurative otitis media, bilateral: Secondary | ICD-10-CM

## 2018-03-17 MED ORDER — FEXOFENADINE 180 MG TABLET
180.0000 mg | ORAL_TABLET | Freq: Every day | ORAL | 0 refills | Status: DC
Start: 2018-03-17 — End: 2018-03-31

## 2018-03-17 MED ORDER — FLUTICASONE PROPIONATE 50 MCG/ACTUATION NASAL SPRAY,SUSPENSION
2.0000 | Freq: Every day | NASAL | 0 refills | Status: DC
Start: 2018-03-17 — End: 2018-03-31

## 2018-03-17 NOTE — Progress Notes (Signed)
History of Present Illness: Barbara Graves is a 22 y.o. female who presents to the [facility] today with chief complaint of    Chief Complaint            Ear Pain seen 2 weeks ago. finished steroid no improvment       .   [facility] with c/o bilateral otalgia onset ~1 month ago. She was last seen on 03/08/2018 for ear ache and sore throat, where she was diagnosed with bilateral serous OM. Pt's rapid strep test was negative at that visit and she was prescribed a short course of steroids and Astelin nasal spray. She took her steroids as prescribed but it has not seemed to help her bilateral ear pain and pressure. Pt sts that hear ear pain radiates down into her jaw and her cervical lymph nodes. Pt is also very fatigued and admits to feeling tired even after sleeping for 12+ hours. 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 For her sxs, she has been taking Ibuprofen and Astelin with no relief.  At this time, pt associates fatigue, bilateral ear fullness, bilateral ear pressure, jaw pain, sinus drainage, and lymph node pain and enlargement. She denies fever, chills, muffled hearing, sinus congestion, sore throat, cough, nausea, vomiting and diarrhea.        Functional Health Screening:    Patient is under 18:  No  Have you had a recent unexplained weight loss or gain?:  No  Because we are aware of abuse and domestic violence today, we ask all patients: Are you being hurt, hit, or frightened by anyone at your home or in your life?:  No  Do you have any basic needs within your home that are not being met? (such as Food, Shelter, Games developer, Transportation):  No  Patient is under 18 and therefore has no Advance Directives:  No  Patient has:  No Advance  Patient has Advance Directive:  No  Patient offered:  Refused Packet  Screening unable to be completed:  No         I reviewed and confirmed the patient's past medical history taken by the nurse or medical assistant with the addition of the  following:    Past Medical History:    History reviewed. No pertinent past medical history.    Past Surgical History:    Past Surgical History:   Procedure Laterality Date   . HX NO SURGICAL PROCEDURES       Allergies:  No Known Allergies    Medications:    Current Outpatient Medications   Medication Sig   . azelastine (ASTELIN) 137 mcg (0.1 %) Nasal Aerosol, Spray 1 Spray by INTRANASAL route Twice per day as needed for Rhinitis for up to 14 days Use in each nostril as directed   . fexofenadine (ALLEGRA) 180 mg Oral Tablet Take 1 Tab (180 mg total) by mouth Once a day   . fluticasone propionate (FLONASE) 50 mcg/actuation Nasal Spray, Suspension 2 Sprays by Each Nostril route Once a day   . oral contraceptive (PATIENT'S OWN SUPPLY) Take 1 Tab by mouth Once a day   . predniSONE (DELTASONE) 5 mg Oral Tablet 4 tabs po x 1 day then 3 tabs po x 1 day then 2 tabs po x 1 day then 1 tab po x 1 day then stop (Patient not taking: Reported on 03/17/2018)     Social History:    Social History     Tobacco Use   .  Smoking status: Never Smoker   . Smokeless tobacco: Never Used   Substance Use Topics   . Alcohol use: Never     Frequency: Never   . Drug use: Never     Family History: No significant family history.  Family Medical History:     Problem Relation (Age of Onset)    No Known Problems Mother, Father, Maternal Grandmother, Maternal Grandfather, Paternal Grandmother, Paternal Grandfather        Review of Systems:     General: fatigue, no fever  ENT: bilateral otalgia, bilateral earl fullness, bilateral ear pressure, sinus drainage, no sinus congestion, no sore throat, no muffled hearing   Pulmonary: no cough   Gastrointestinal: no nausea, no vomiting, no diarrhea  Musculoskeletal: jaw pain   Heme/Lymph: lymph node pain and enlargement   All other review of systems were negative.     Physical Exam:  Vital signs:   Vitals:    03/17/18 1226   BP: 122/72   Pulse: 57   Resp: 18   Temp: 36.4 C (97.6 F)   TempSrc: Thermal Scan    SpO2: 99%   Weight: 53.5 kg (117 lb 14.4 oz)   Height: 1.585 m (5' 2.4")   BMI: 21.33     Facility age limit for growth percentiles is 20 years.    Body mass index is 21.29 kg/m. Facility age limit for growth percentiles is 20 years.  Patient's last menstrual period was 02/22/2018.    General:  Well appearing and no acute distress  ENT:  Normal EAC's, small amount of clear effusion behind bilateral TM's, MMM, normal pharynx/tonsils, normal tongue/uvula and no sinus tenderness to palpation  Neck:  Supple  Pulmonary:  Clear to auscultation bilaterally, no wheezes, no rales and no rhonchi  Cardiovascular:  Regular rate/rhythm, normal S1/S2 and no murmur/rub/gallop  Skin:  Warm/dry and no rash  Psychiatric:  Appropriate affect and behavior  Neurologic:   Alert and oriented x 3  Hem/Lymph:  Mild tenderness and enlargement to posterior cervical lymphadenopathy     Data Reviewed:    Not applicable    Course: Condition at discharge: Good     Differential Diagnosis: bilateral serous OM vs ETD vs allergies     Assessment:   1. Serous otitis media        Plan:    Orders Placed This Encounter   . fluticasone propionate (FLONASE) 50 mcg/actuation Nasal Spray, Suspension   . fexofenadine (ALLEGRA) 180 mg Oral Tablet         - Suspect ear symptoms most likely from serous otitis media as above, no clear indication for abx today  - Will have pt start flonase and allegra daily in addition to continuing astelin  - Pt was noted to have posterior tender lymphadenopathy on exam, cannot rule out re-activation of mono as she is also having persistent fatigue.  Pt declined blood testing for EBV. She also denied a referral to ENT.  Suggested to avoid contact sports for 4 weeks. Rest and stay hydrated.   - Can alternate Tylenol and Motrin for pain as needed.  - Follow up in clinic if sxs persist.     Go to Emergency Department immediately for further work up if any concerning symptoms.  Plan was discussed and patient verbalized  understanding.  If symptoms are worsening or not improving the patient should return to the [facility] for further evaluation.    I am scribing for, and in the presence of, Dr. Guy Franco  for services provided on 03/17/2018.  Danley Danker, SCRIBE     Danley Danker, SCRIBE 03/17/2018, 12:42    I personally performed the services described in this documentation, as scribed  in my presence, and it is both accurate  and complete.    Guy Franco, DO

## 2018-03-17 NOTE — Patient Instructions (Signed)
[place]     Operated by [facility]  [address], [place] [address]  Phone: [phone]  Fax: [phone]  [email or web address]  Twitter [email or web address]   Closed all  Holidays        Attending Caregiver: [facility]    Today's orders:   Orders Placed This Encounter   . fluticasone propionate (FLONASE) 50 mcg/actuation Nasal Spray, Suspension   . fexofenadine (ALLEGRA) 180 mg Oral Tablet         Prescription(s) E-Rx to:  [id number] [place], [place]    Future appts with [facility]  Visit date not found    [place]. THIS IS ONE EASY WAY TO COMMUNICATE WITH [facility].  SEE THE CODE ON THIS FORM FOR ACCESS.    -----------------------------PRIVACY INFORMATION-----------------------------------------------  As a [employer] student, regardless of your age, we cannot discuss your personal health information with a parent, spouse, family member or anyone else without your expressed consent.    This policy does not include:  Individuals who would have a legitimate reason to access your records and information to assist in your care under the provisions of HIPAA ([place] and [place]) law;   Individuals with whom you have previously given expressed written consent to do so, such a legal guardian or Power of Attorney.    No one can access your [facility], unless you give them your account sign on and password. You will receive emails from [place].  This means anyone who has access to the email account you provided can see this notification. There will be no private medical information included in these emails. This notification of  new medical information  available in your [facility], may be information that you do not want others to know.   _______________________________________________________________________    If your symptoms persist, worsen or you  develop any new or concerning symptoms please call [place] for at [phone] for a follow up appointment.   If your symptoms are severe, go immediately to the emergency department or call 911.  Please check with your health insurance coverage for these types of visits.   Please keep all follow up visit recommended at today's visit.    If you received x-rays during your visit, be aware that the final and formal interpretation of those films by a radiologist will occur after your discharge.  If there is a significant discrepancy identified after your discharge, we will contact you at the telephone number provided during registration.  These results are available for your review on [facility].    Please refer to your [facility] for lab test results. Lab test results related to HIV, STI's and pregnancy are considered private and are therefore not immediately visible in your [facility], we may still be able to send a generic MyChart message for these results.  If you have cultures pending for sexually transmitted diseases, you will be contacted by phone.     Positive cultures are reported to the [place] Department of Health, as required by state law. These results are considered private on [facility] and can only be released by the provider.We will not contact you if the results are negative.    It is very important that we have a phone number that is the single best way to contact you in the event that we become aware of important clinical information or concerns after your discharge.  If the phone number you  provided at registration is NOT this number you should inform staff and registration prior to leaving.     Please call [place] at [phone] with any further questions.    Instructions discussed with patient upon discharge by clinical staff with all questions answered.    Guy Franco, DO 03/17/2018, 12:41

## 2018-03-24 ENCOUNTER — Encounter (FREE_STANDING_LABORATORY_FACILITY)
Admit: 2018-03-24 | Discharge: 2018-03-24 | Disposition: A | Discharge status: Home / Self Care | Payer: BC Managed Care – PPO | Attending: Family Medicine | Admitting: Family Medicine

## 2018-03-24 ENCOUNTER — Encounter (INDEPENDENT_AMBULATORY_CARE_PROVIDER_SITE_OTHER): Payer: Self-pay

## 2018-03-24 ENCOUNTER — Ambulatory Visit (INDEPENDENT_AMBULATORY_CARE_PROVIDER_SITE_OTHER): Payer: BC Managed Care – PPO

## 2018-03-24 ENCOUNTER — Encounter (FREE_STANDING_LABORATORY_FACILITY): Payer: BC Managed Care – PPO | Admitting: Family Medicine

## 2018-03-24 VITALS — BP 137/84 | HR 87 | Temp 98.5°F | Resp 20 | Ht 62.64 in | Wt 119.3 lb

## 2018-03-24 DIAGNOSIS — Z3202 Encounter for pregnancy test, result negative: Secondary | ICD-10-CM

## 2018-03-24 DIAGNOSIS — Z6821 Body mass index (BMI) 21.0-21.9, adult: Secondary | ICD-10-CM

## 2018-03-24 DIAGNOSIS — H9203 Otalgia, bilateral: Secondary | ICD-10-CM

## 2018-03-24 DIAGNOSIS — Z7251 High risk heterosexual behavior: Secondary | ICD-10-CM

## 2018-03-24 MED ORDER — NAPROXEN SODIUM ER (CR) 500 MG TABLET,EXTENDED RELEASE 24 HR MPHASE
500.0000 mg | EXTENDED_RELEASE_TABLET | Freq: Every day | ORAL | 0 refills | Status: AC
Start: 2018-03-24 — End: 2018-03-31

## 2018-03-24 MED ORDER — CIPROFLOXACIN 0.3 %-DEXAMETHASONE 0.1 % EAR DROPS,SUSPENSION: 4 [drp] | mL | Freq: Two times a day (BID) | OTIC | 0 refills | 0 days | Status: AC

## 2018-03-24 NOTE — Progress Notes (Signed)
History of Present Illness: Barbara Graves is a 22 y.o. female who presents to the Urgent Care today with chief complaint of    Chief Complaint            Ear Ache bilateral ear pain x 1 month. hurts down into neck    STD Screening       .     Patient states she is having ear discomfort for several months. She was seen at student health twice for this. She was placed on allergy medications, both nasal and oral, w/o improvement. She denies nasal congestion, itching, sneezing. She feels this is different than allergies. No ear d/c currently. Denies fevers. No hearing changes. The discomfort is worse when talking. She does notice clicking of jaw. Unsure if she grinds her teeth. She is in pharm school and notes anxiety symptoms. She last saw dentist >1 yr ago.     Also, request STI screening. She does have a new partner. No current symptoms of vaginal d/c, itching, rash. She does not want to get blood drawn.     I reviewed and confirmed the patient's past medical history taken by the nurse or medical assistant with the addition of the following:    Past Medical History:    History reviewed. No pertinent past medical history.  Past Medical History was reviewed and is negative for allergic symptoms.     Past Surgical History:    Past Surgical History:   Procedure Laterality Date   . HX NO SURGICAL PROCEDURES           Allergies:  No Known Allergies  Medications:    Current Outpatient Medications   Medication Sig   . ciprofloxacin-dexamethasone (CIPRODEX) 0.3-0.1 % Otic Drops, Suspension Instill 4 Drops into both ears Twice daily for 7 days Use for 7 days   . fexofenadine (ALLEGRA) 180 mg Oral Tablet Take 1 Tab (180 mg total) by mouth Once a day (Patient not taking: Reported on 03/24/2018)   . fluticasone propionate (FLONASE) 50 mcg/actuation Nasal Spray, Suspension 2 Sprays by Each Nostril route Once a day (Patient not taking: Reported on 03/24/2018)   . Naproxen Sodium 500 mg Oral Tab, Multiphasic Release 24 hr Take 1  Tab (500 mg total) by mouth Once a day for 7 days   . oral contraceptive (PATIENT'S OWN SUPPLY) Take 1 Tab by mouth Once a day   . predniSONE (DELTASONE) 5 mg Oral Tablet 4 tabs po x 1 day then 3 tabs po x 1 day then 2 tabs po x 1 day then 1 tab po x 1 day then stop (Patient not taking: Reported on 03/17/2018)     Social History:    Social History     Tobacco Use   . Smoking status: Never Smoker   . Smokeless tobacco: Never Used   Substance Use Topics   . Alcohol use: Never     Frequency: Never   . Drug use: Never     Family History: No significant family history.  Family Medical History:     Problem Relation (Age of Onset)    No Known Problems Mother, Father, Maternal Grandmother, Maternal Grandfather, Paternal Grandmother, Paternal Grandfather            Review of Systems:    General: no fever  ENT:  no sore throat, otalgia right, otalgia left and no runny nose  Eyes:  no discharge and no redness  Genitourinary:  no dysuria  Heme/Lymph:  swollen lymph  glands    Physical Exam:  Vital signs:   Vitals:    03/24/18 1218   BP: 137/84   Pulse: 87   Resp: 20   Temp: 36.9 C (98.5 F)   TempSrc: Tympanic   SpO2: 99%   Weight: 54.1 kg (119 lb 4.3 oz)   Height: 1.591 m (5' 2.64")   BMI: 21.42     Facility age limit for growth percentiles is 20 years.    Body mass index is 21.37 kg/m. Facility age limit for growth percentiles is 20 years.  Patient's last menstrual period was 02/22/2018.    General:  Well appearing and No acute distress  Head:  Normocephalic and Atraumatic  Eyes:  Normal lids/lashes, PERRL and normal conjunctiva  ENT:  normal TM's, mild erythema of b/l ear canals, no tenderness to palpation of tragus b/l, and MMM  Neck:  supple  Pulmonary:  clear to auscultation bilaterally and no wheezes  Cardiovascular:  regular rate/rhythm and normal S1/S2  Skin:  warm/dry and no rash  Hem/Lymph:  one soft mobile lymphnode about the size of a marble in left upper neck just below mandibular angle    Data  Reviewed:      Point-of-care testing:     Urine Pregnancy: Negative                      Course: Condition at discharge: Stable    Differential Diagnosis: TMJ syndrome vs Bilateral otitis externa vs eustachian tube dysfunction     Assessment:   1. Otalgia of both ears    2. High risk heterosexual behavior        Plan:    Orders Placed This Encounter   . Chlamydia Trachomitis by PCR   . NEISSERIA GONORRHOEAE DNA BY PCR   . Urine Pregnancy   . ciprofloxacin-dexamethasone (CIPRODEX) 0.3-0.1 % Otic Drops, Suspension   . Naproxen Sodium 500 mg Oral Tab, Multiphasic Release 24 hr       Otalgia:   Start Ciprodex drops b/l.   Start Naproxen for anti-inflammatory.   Recommend follow up with dental clinic to eval for TMJ/teeth grinding.     High risk heterosexual behavior.       Screening for GC/Chlamydia via urine specimen.   Declined additional STI testing via bld draw (HIV, RPR, Hep C).   Recommend safe sex practices.       Go to Emergency Department immediately for further work up if any concerning symptoms.  Plan was discussed and patient verbalized understanding.  If symptoms are worsening or not improving the patient should return to the Urgent Care for further evaluation.    Raleigh Callas, MD 03/24/2018, 12:54     I saw and examined the patient.  I reviewed the resident's note.  I agree with the findings and plan of care as documented in the resident's note.  Any exceptions/additions are edited/noted.    Aura Fey, MD

## 2018-03-24 NOTE — Patient Instructions (Signed)
[facility]      Operated by [facility]  [address] [place], [place] [address]  Phone: [phone])  Fax: [phone]  [email or web address]  Open Daily 8:00a - 8:00p    Closed Thanksgiving and Christmas Day  [facility]     Operated by [facility]  [address] [place], [place] [address]  Phone: [phone])  Fax: [phone]  [email or web address]  Open M-F  8:00a - 8:00p  Sat              10:00a - 4:00p  Sun             Closed  Closed all [place] Holidays        Attending Caregiver: Raleigh Callas, MD      Today's orders:   Orders Placed This Encounter   . Chlamydia Trachomitis by PCR   . NEISSERIA GONORRHOEAE DNA BY PCR   . Urine Pregnancy   . ciprofloxacin-dexamethasone (CIPRODEX) 0.3-0.1 % Otic Drops, Suspension   . Naproxen Sodium 500 mg Oral Tab, Multiphasic Release 24 hr        Prescription(s) E-Rx to:  [id number] [place], [place]    ________________________________________________________________________  Short Term Disability and [facility] does NOT provide assistance with any disability applications.  If you feel your medical condition requires you to be on disability, you will need to follow up with  your primary care physician or a specialist.  We apologize for any inconvenience.    For Medication Prescribed by [facility]:  As an Urgent Care facility, our clinic does NOT offer prescription refills over the telephone.    If you need more of the medication one of our medical providers prescribed, you will  either need to be re-evaluated by [place] or see your primary care physician.    ________________________________________________________________________      It is very important that we have a phone number.  This is the single best way to contact you in the event that we become aware of important clinical information  or concerns after your discharge.  If the phone number you provided at registration is NOT this number you should inform staff and registration prior to leaving.      Your treatment and evaluation today was focused on identifying and treating potentially emergent conditions based on your presenting signs, symptoms, and history.  The resulting initial clinical impression and treatment plan is not intended to be definitive or a substitute for a full physical examination and evaluation by your primary care provider.  If your symptoms persist, worsen, or you develop any new or concerning symptoms, you need to be evaluated.      If you received x-rays during your visit, be aware that the final and formal interpretation of those films by a radiologist may occur after your discharge.  If there is a significant discrepancy identified after your discharge, we will contact you at the telephone number provided at registration.      If you received a pelvic exam, you may have cultures pending for sexually transmitted diseases.  Positive cultures are reported to the [place] Department of Health as required by state law.  You may contact the Health Information Management Office of [facility] to get a copy of your results.     If you are over 99 year old, we cannot discuss  your personal health information with a parent, spouse, family member, or anyone else without your consent.  This does not include those who have legitimate access to your records and information to assist in your care under the provisions of HIPAA ([place] and [place]) law, or those to whom you have previously given written consent to do so, such a legal guardian or Power of [place].      Instructions are discussed with patient upon discharge by clinical staff with all questions answered.  Please call [facility] [phone]) if any further questions develop.  Go immediately to the emergency department if any  concerns or worsening symptoms.      Raleigh Callas, MD 03/24/2018, 12:50

## 2018-03-29 LAB — CHLAMYDIA TRACHOMITIS DNA BY PCR (INHOUSE): CHLAMYDIA TRACHOMATIS PCR: NOT DETECTED

## 2018-03-29 LAB — NEISSERIA GONORRHOEAE DNA BY PCR: NEISSERIA GONORRHOEAE PCR: NOT DETECTED

## 2018-03-31 ENCOUNTER — Encounter (INDEPENDENT_AMBULATORY_CARE_PROVIDER_SITE_OTHER): Payer: Self-pay | Admitting: Women's Health

## 2018-03-31 ENCOUNTER — Ambulatory Visit (INDEPENDENT_AMBULATORY_CARE_PROVIDER_SITE_OTHER): Payer: BC Managed Care – PPO | Admitting: Women's Health

## 2018-03-31 ENCOUNTER — Encounter (FREE_STANDING_LABORATORY_FACILITY)
Admit: 2018-03-31 | Discharge: 2018-03-31 | Disposition: A | Discharge status: Home / Self Care | Payer: BC Managed Care – PPO | Attending: Women's Health | Admitting: Women's Health

## 2018-03-31 ENCOUNTER — Encounter (FREE_STANDING_LABORATORY_FACILITY): Payer: BC Managed Care – PPO | Admitting: Women's Health

## 2018-03-31 VITALS — BP 140/80 | HR 90 | Temp 98.0°F | Resp 18 | Ht 62.0 in | Wt 119.0 lb

## 2018-03-31 DIAGNOSIS — N898 Other specified noninflammatory disorders of vagina: Secondary | ICD-10-CM | POA: Insufficient documentation

## 2018-03-31 DIAGNOSIS — B3731 Acute candidiasis of vulva and vagina: Secondary | ICD-10-CM

## 2018-03-31 DIAGNOSIS — N949 Unspecified condition associated with female genital organs and menstrual cycle: Secondary | ICD-10-CM

## 2018-03-31 DIAGNOSIS — R102 Pelvic and perineal pain: Secondary | ICD-10-CM

## 2018-03-31 DIAGNOSIS — Z6821 Body mass index (BMI) 21.0-21.9, adult: Secondary | ICD-10-CM

## 2018-03-31 DIAGNOSIS — Z7251 High risk heterosexual behavior: Secondary | ICD-10-CM

## 2018-03-31 DIAGNOSIS — Z113 Encounter for screening for infections with a predominantly sexual mode of transmission: Secondary | ICD-10-CM

## 2018-03-31 DIAGNOSIS — B373 Candidiasis of vulva and vagina: Secondary | ICD-10-CM

## 2018-03-31 MED ORDER — VALACYCLOVIR 1 GRAM TABLET: 1000 mg | Tab | Freq: Two times a day (BID) | ORAL | 0 refills | 0 days | Status: AC

## 2018-03-31 MED ORDER — FLUCONAZOLE 150 MG TABLET
150.0000 mg | ORAL_TABLET | ORAL | 0 refills | Status: DC
Start: 2018-03-31 — End: 2018-07-22

## 2018-03-31 NOTE — Patient Instructions (Addendum)
The Herpes Virus  Herpes is a virus that can cause sores on the skin. There are 2 types of the virus. Depending on how you come in contact with the virus, either type can cause outbreaks near the mouth or on the sex organs.  Understanding the herpes virus  Herpes reproduces only when it is inside the body. It does so by tricking a healthy cell into producing copies of the herpes virus. Each copy can infect nearby cells. But, before too long, the body's defenses rally to stop the attack. The immune system forces the virus to retreat.Even then, the virus stays inside the body but does not cause disease. For some people, an acute outbreak never happens again. For others, outbreaks are more likely to occur due to menstruation, illness, poor diet, fatigue,exposure to cold or strong sunlight,or stress.    How the herpes virus attacks  1. The herpes virus enters the body through a small break in the skin. The virus can also enter by direct contact with mucous membranes, such as those of the lips, vagina, or anus.  2. Inside the body, the herpes virus binds to a special site on a skin cell. Then part of the virus moves into the cell.  3. Inside the skin cell, the virus releases a set of instructions. These commands cause the cell to begin making copies of the herpes virus.  4. Herpes blisters appear on the skin. Herpes blisters may also appear on mucous membranes lining the mouth, vagina, or anus.    Date Last Reviewed: 09/23/2015   2000-2019 [employer]. [address] [place] [address]. All rights reserved. This information is not intended as a substitute for professional medical care. Always follow your healthcare professional's instructions.        Diagnosing Herpes  You will be asked about your health history. You may be asked about your eating and sleeping habits and sexual history. Mentionif you have sores or if you have had any in the past. Also mention if you feel tingling or itching  before an outbreak.  What a sore looks like       A herpes sore may first appear as a small white blister. The fluid inside the blister is filled with the herpes virus. At this stage the virus sheds easily. This means it can be passed to other people.  A soft wet ulcer may form in place of the blister. The herpes virus is in the fluid of the open sore. As a result, the virus can still be spread to others.             A soft crust forms as a new layer of skin grows. Fewer copies of the virus are present in the sore.  The skin surface is normal, but the virus remains in the body. Shedding is less likely, but it can still occur.    Testing for herpes  If herpes is suspected, tests such as thesemay be done to confirm the diagnosis:   Viral culture. A small amount of fluid is swabbed from the base of a blister. The fluid is grown in a special culture with healthy cells. If herpes is present, it will alter the look of the cells.   Fluorescent antibody test. Cells are taken from the base of a blister. They are stained and checked under a microscope. If herpes is present, the cells will change color.   Molecular amplification. A sample of fluid suspected of containing  herpes virus is mixed with chemicals that allow pieces of the virus to multiply very quickly.These viral fragments can be detected very rapidly.   Other tests. If sores are not present, tests can be run on blood or cell samples. These tests show if you carry the herpes virus.   Date Last Reviewed: 09/23/2015   2000-2019 [employer]. [address], [place] [address]. All rights reserved. This information is not intended as a substitute for professional medical care. Always follow your healthcare professional's instructions.      [place]     Operated by [facility]  [place], [place] [address]  Phone: [phone]  Fax:  [phone]  [email or web address]  Twitter [email or web address]   Closed all  Holidays        Attending Caregiver: Barbaraann Barthel, APRN,WHNP-BC    Today's orders:   Orders Placed This Encounter   . CHLAMYDIA TRACHOMITIS DNA BY PCR (INHOUSE)   . NEISSERIA GONORRHOEAE DNA BY PCR   . HERPES SIMPLEX VIRUS (HSV1/HSV2), PCR, CSF OR SWAB   . POCT VAGINAL KOH & SALINE ([place]ET [place] ONLY)   . valACYclovir (VALTREX) 1 gram Oral Tablet         Prescription(s) E-Rx to:  [id number] [place], [place]    Future appts with Student Health  Visit date not found    [place]. THIS IS ONE EASY WAY TO COMMUNICATE WITH [facility].  SEE THE CODE ON THIS FORM FOR ACCESS.    -----------------------------PRIVACY INFORMATION-----------------------------------------------  As a [employer] student, regardless of your age, we cannot discuss your personal health information with a parent, spouse, family member or anyone else without your expressed consent.    This policy does not include:  Individuals who would have a legitimate reason to access your records and information to assist in your care under the provisions of HIPAA ([place] and [place]) law;   Individuals with whom you have previously given expressed written consent to do so, such a legal guardian or Power of Attorney.    No one can access your My[facility], unless you give them your account sign on and password. You will receive emails from [place].  This means anyone who has access to the email account you provided can see this notification. There will be no private medical information included in these emails. This notification of  new medical information  available in your [facility], may be information that you do not want others to know.   _______________________________________________________________________    If your symptoms persist, worsen or you develop any  new or concerning symptoms please call [facility] for at [phone] for a follow up appointment.   If your symptoms are severe, go immediately to the emergency department or call 911.  Please check with your health insurance coverage for these types of visits.   Please keep all follow up visit recommended at today's visit.    If you received x-rays during your visit, be aware that the final and formal interpretation of those films by a radiologist will occur after your discharge.  If there is a significant discrepancy identified after your discharge, we will contact you at the telephone number provided during registration.  These results are available for your review on [facility].    Please refer to your [facility] for lab test results. Lab test results related to HIV, STI's and pregnancy are considered private and are  therefore not immediately visible in your My[facility]Chart, we may still be able to send a generic MyChart message for these results.  If you have cultures pending for sexually transmitted diseases, you will be contacted by phone.     Positive cultures are reported to the [place] Department of Health, as required by state law. These results are considered private on My[facility]Chart and can only be released by the provider.We will not contact you if the results are negative.    It is very important that we have a phone number that is the single best way to contact you in the event that we become aware of important clinical information or concerns after your discharge.  If the phone number you provided at registration is NOT this number you should inform staff and registration prior to leaving.     Please call [facility] at [phone] with any further questions.    Instructions discussed with patient upon discharge by clinical staff with all questions answered.    Barbaraann Barthel, APRN,WHNP-BC 03/31/2018, 17:53        Preventing Vaginal Infection  These steps can help you stay comfortable during  treatment of a vaginal infection. They also help prevent vaginal infections in the future.  Keeping a healthy balance  Factors that change the normal balance in the vagina can lead to a vaginal infection. To help keep the balance normal, try these tips:   Change out of wet bathing suits and damp exercise clothes as soon as possible. Yeast thrive in a warm, moist environment.   Avoid wearing tight pants. Choose cotton underwear and pantyhose that have a cotton crotch. Cotton keeps you cooler and drier than synthetics.   Don't douche unless directed by your healthcare provider. Douching can destroy friendly bacteria and change the vagina's normal balance.   Wipe from front to back after using the toilet. This prevents bacteria from spreading from the anus to the vulva.   Wash the vulva with mild, unscented soap or with plain water.   Wash your diaphragm, spermicide applicators, and sex toys with mild soap and water after use. Dry them thoroughly before putting them away.   Change tampons often (every 2 hoursto 4 hours). Leaving a tampon in for too long may disrupt the balance of vaginal bacteria.   Avoid vaginal sprays, scented toilet paper and soaps, and deodorant tampons or pads, which can cause vaginal irritation  Staying healthy overall  Good overall health can help you resist infection. To be healthier:   Help protect yourself from STIs (sexually transmitted infections) by using latex condoms for intercourse. Ask your healthcare provider for more information about safer sex.   Eat a variety of healthy foods.   Exercise regularly.   Get enough rest and sleep.   Maintain a healthy weight. If you need to lose weight, ask your healthcare provider for advice on how to start.  Date Last Reviewed: 05/23/2016   2000-2019 [employer]. [address] [place] [address]. All rights reserved. This information is not intended as a substitute for professional medical care. Always follow your  healthcare professional's instructions.    [place]     Operated by [facility]  [place], [place] [address]  Phone: [phone]  Fax: [phone]  [email or web address]  Twitter [email or web address]   Closed all  Holidays        Attending Caregiver: Barbaraann Barthel, APRN,WHNP-BC    Today's orders:   Orders Placed This Encounter   .  CHLAMYDIA TRACHOMITIS DNA BY PCR (INHOUSE)   . NEISSERIA GONORRHOEAE DNA BY PCR   . HERPES SIMPLEX VIRUS (HSV1/HSV2), PCR, CSF OR SWAB   . POCT VAGINAL KOH & SALINE (WET [place] ONLY)   . valACYclovir (VALTREX) 1 gram Oral Tablet   . fluconazole (DIFLUCAN) 150 mg Oral Tablet         Prescription(s) E-Rx to:  [id number] [place], [address]    Future appts with Student Health  Visit date not found    [place]. THIS IS ONE EASY WAY TO COMMUNICATE WITH [facility].  SEE THE CODE ON THIS FORM FOR ACCESS.    -----------------------------PRIVACY INFORMATION-----------------------------------------------  As a [employer] student, regardless of your age, we cannot discuss your personal health information with a parent, spouse, family member or anyone else without your expressed consent.    This policy does not include:  Individuals who would have a legitimate reason to access your records and information to assist in your care under the provisions of HIPAA ([place] and [place]) law;   Individuals with whom you have previously given expressed written consent to do so, such a legal guardian or Power of Attorney.    No one can access your My[facility]Chart, unless you give them your account sign on and password. You will receive emails from [facility].  This means anyone who has access to the email account you provided can see this notification. There will be no private medical information included in these emails. This notification of  new medical information   available in your My[facility], may be information that you do not want others to know.   _______________________________________________________________________    If your symptoms persist, worsen or you develop any new or concerning symptoms please call [facility] for at [phone] for a follow up appointment.   If your symptoms are severe, go immediately to the emergency department or call 911.  Please check with your health insurance coverage for these types of visits.   Please keep all follow up visit recommended at today's visit.    If you received x-rays during your visit, be aware that the final and formal interpretation of those films by a radiologist will occur after your discharge.  If there is a significant discrepancy identified after your discharge, we will contact you at the telephone number provided during registration.  These results are available for your review on My[facility].    Please refer to your My[facility]Chart for lab test results. Lab test results related to HIV, STI's and pregnancy are considered private and are therefore not immediately visible in your My[facility]Chart, we may still be able to send a generic MyChart message for these results.  If you have cultures pending for sexually transmitted diseases, you will be contacted by phone.     Positive cultures are reported to the [place] Department of Health, as required by state law. These results are considered private on My[facility]Chart and can only be released by the provider.We will not contact you if the results are negative.    It is very important that we have a phone number that is the single best way to contact you in the event that we become aware of important clinical information or concerns after your discharge.  If the phone number you provided at registration is NOT this number you should inform staff and registration prior to leaving.     Please call [facility] at  [phone] with any further questions.    Instructions discussed  with patient upon discharge by clinical staff with all questions answered.    Barbaraann Barthel, APRN,WHNP-BC 03/31/2018, 17:54

## 2018-03-31 NOTE — Progress Notes (Signed)
[place], [facility]  [place] [address]    PATIENT NAME:  Barbara Graves  MRN:  [id number]  DOB:  August 16, 1996  DATE OF SERVICE: 03/31/2018    Chief Complaint   Patient presents with   . Vaginal Irritation/Pain       History of Present Illness: Barbara Graves is a 22 y.o. female who presents to [facility] today for the above complaint. Has been with current partner for about 1 month. Did have unprotected sex about 1 week ago. Few days ago noticed some "cuts" and vaginal pain/discomfort. Denies having any other vaginal symptoms.   HPI      Past Medical History:    No past medical history on file.      Past Surgical History:    Past Surgical History:   Procedure Laterality Date   . HX NO SURGICAL PROCEDURES           Allergies:  No Known Allergies  Medications:  Outpatient Medications Marked as Taking for the 03/31/18 encounter (Office Visit) with Barbaraann Barthel, APRN,WHNP-BC   Medication Sig   . fluconazole (DIFLUCAN) 150 mg Oral Tablet Take 1 Tab (150 mg total) by mouth Every 3 days   . valACYclovir (VALTREX) 1 gram Oral Tablet Take 1 Tab (1 g total) by mouth Twice daily for 10 days     Social History:    Social History     Tobacco Use   . Smoking status: Never Smoker   . Smokeless tobacco: Never Used   Substance Use Topics   . Alcohol use: Never     Frequency: Never      Family History:  Family Medical History:     Problem Relation (Age of Onset)    No Known Problems Mother, Father, Maternal Grandmother, Maternal Grandfather, Paternal 72, Paternal Grandfather            Review of Systems:  Review of Systems   Genitourinary: Positive for genital sores, vaginal discharge and vaginal pain. Negative for difficulty urinating, frequency, menstrual problem, pelvic pain, urgency and vaginal bleeding.   All other systems reviewed and are negative.    Physical Exam:  Vitals:    03/31/18 1735   BP: 140/80   Pulse: 90   Resp: 18     Temp: 36.7 C (98 F)   TempSrc: Thermal Scan   SpO2: 99%   Weight: 54 kg (119 lb)   Height: 1.575 m (5\' 2" )   BMI: 21.81     Facility age limit for growth percentiles is 20 years.    Body mass index is 21.77 kg/m.  Physical Exam   Constitutional: She is oriented to person, place, and time. She appears well-developed and well-nourished.   HENT:   Head: Normocephalic and atraumatic.   Eyes: Pupils are equal, round, and reactive to light. EOM are normal.   Neck: Normal range of motion.   Pulmonary/Chest: Effort normal.   Abdominal: Soft.   Genitourinary: Uterus normal. No labial fusion. There is tenderness and lesion on the right labia. There is no rash or injury on the right labia. There is tenderness and lesion on the left labia. There is no rash or injury on the left labia. Cervix exhibits discharge and friability. Cervix exhibits no motion tenderness. Right adnexum displays no mass, no tenderness and no fullness. Left adnexum displays no mass, no tenderness and no fullness. There is erythema and tenderness in the  vagina. No bleeding in the vagina. No foreign body in the vagina. No signs of injury around the vagina. Vaginal discharge found.   Musculoskeletal: Normal range of motion.   Neurological: She is alert and oriented to person, place, and time.   Skin: Skin is warm and dry.   Psychiatric: She has a normal mood and affect. Her behavior is normal. Judgment and thought content normal.     Ortho Exam    Data Reviewed:    POCT Results:       Provider Performed POCT 03/31/2018   [facility] [address], [place], [place] [address]   Time Wed Mar 31, 2018  6:16 PM   Trichomonas Test  None Seen   Trichomonas Reference Range (Required) None Seen   Yeast Test  Seen   Yeast Ref Range(Required) None Seen   Clue Cells Test None Seen   Clue Cells Ref Range (Required) None Seen   Specimen Source Vaginal Fluid   Additional Info positive WBC's          I have reviewed and confirmed the above point of care  results.  Barbaraann Barthel, APRN,WHNP-BC 03/31/2018, 18:16  UML labs ordered    Assessment:   1. Vaginal irritation    2. Vaginal pain    3. Genital lesion, female    4. Routine screening for STI (sexually transmitted infection)    5. Unprotected sex    49. Vaginal yeast infection      Plan:    Orders Placed This Encounter   . CHLAMYDIA TRACHOMITIS DNA BY PCR (INHOUSE)   . NEISSERIA GONORRHOEAE DNA BY PCR   . HERPES SIMPLEX VIRUS (HSV1/HSV2), PCR, CSF OR SWAB   . POCT VAGINAL KOH & SALINE (WET M[place] ONLY)   . valACYclovir (VALTREX) 1 gram Oral Tablet   . fluconazole (DIFLUCAN) 150 mg Oral Tablet     STD testing will be back in a few days and we will call with results all positive STDs are reported to the [facility] per [facility] law. The best way to protect yourself from STDs is reducing number of partners and 100% condom use   Start Valtrex today as directed. Will call with results  No sex with vaginal sores  Discussed with pt about herpes  Discussed vaginal hygiene. Avoid all scented products.   Take Duflican as directed.  Discussed safe sex and the use of condoms 100%  Return in about 1 week (around 04/07/2018) for follow-up.  Barbaraann Barthel, APRN,WHNP-BC  The co-signing faculty was physically present in Student health and available for consultation and did not particpate in the care of this patient.

## 2018-04-01 LAB — HERPES SIMPLEX VIRUS (HSV1/HSV2), PCR, CSF OR SWAB
HERPES SIMPLEX VIRUS 1: NEGATIVE
HERPES SIMPLEX VIRUS 2: POSITIVE — AB

## 2018-04-02 LAB — NEISSERIA GONORRHOEAE DNA BY PCR: NEISSERIA GONORRHOEAE PCR: NOT DETECTED

## 2018-04-02 LAB — CHLAMYDIA TRACHOMITIS DNA BY PCR (INHOUSE): CHLAMYDIA TRACHOMATIS PCR: NOT DETECTED

## 2018-04-06 ENCOUNTER — Ambulatory Visit (INDEPENDENT_AMBULATORY_CARE_PROVIDER_SITE_OTHER): Payer: BC Managed Care – PPO | Admitting: Women's Health

## 2018-04-06 ENCOUNTER — Encounter (INDEPENDENT_AMBULATORY_CARE_PROVIDER_SITE_OTHER): Payer: Self-pay | Admitting: Women's Health

## 2018-04-06 VITALS — BP 140/80 | HR 75 | Temp 97.0°F | Resp 18 | Ht 62.0 in | Wt 118.0 lb

## 2018-04-06 DIAGNOSIS — Z09 Encounter for follow-up examination after completed treatment for conditions other than malignant neoplasm: Secondary | ICD-10-CM

## 2018-04-06 DIAGNOSIS — A6009 Herpesviral infection of other urogenital tract: Secondary | ICD-10-CM

## 2018-04-06 DIAGNOSIS — Z6821 Body mass index (BMI) 21.0-21.9, adult: Secondary | ICD-10-CM

## 2018-04-06 DIAGNOSIS — Z708 Other sex counseling: Secondary | ICD-10-CM

## 2018-04-06 MED ORDER — VALACYCLOVIR 500 MG TABLET
500.00 mg | ORAL_TABLET | Freq: Two times a day (BID) | ORAL | 5 refills | Status: DC
Start: 2018-04-06 — End: 2019-10-14

## 2018-04-06 NOTE — Patient Instructions (Signed)
Herpes  If you have herpes, you're not alone. Millions of Americans have it. Herpes has no cure. But you can control it and learn how to protect yourself and others from outbreaks.  What is herpes?  Herpes is a chronic (lifelong) virus. It can cause sores and discomfort. You get it from contact with someone who carries the virus. If sores occur on the lips, you have oral herpes. If sores occur on the penis or around the vagina, you have genital herpes.  Herpes outbreaks   The first outbreak of herpes sores is usually the most severe. Then, the soldiers of the body's immune system, white blood cells, produce antibodies. These antibodies help neutralize the herpes virus and may help make future attacks less severe.   Some people have only one outbreak of sores. Some people have periods of frequent outbreaks (every few weeks). Outbreaks of herpes sores usually happen less often over time.   Herpes sores may appear without a cause. Outbreaks are more likely when the immune system is weak. Other viral infections (such as a cold) can cause outbreaks. Stress from a poor diet, fatigue, or emotional upset can lead to outbreaks of sores. Exposure to strong sunlight often causes herpes sores to reappear.  To help prevent outbreaks   To prevent oral herpes outbreaks, avoid overexposure to wind, sun, and extreme temperatures. Use sunscreen and lip balm on affected areas.   If you are having frequent outbreaks, ask your healthcare provider about medicines that can help prevent outbreaks.  How herpes spreads to others  Herpes can be spread during an outbreak. But even without sores present, you can still "shed" the virus and infect others. You can take steps to prevent this.  To protect yourself and others   If you have an oral sore, avoid kissing and oral-genital contact.   If you have a genital sore, avoid intercourse. Also avoid oral-genital contact.   Wash your hands after touching a sore.   Use a condom each time  you have sex. You can pass the virus even when sores aren't present. If you're unsure about the timing of certain kinds of physical contact, ask your health care provider.   Tell any new partners that you have herpes.   If you're a woman, have Pap tests as often as your healthcare provider recommends.   A woman can spread herpes to their newborn during the birth process, whether or not they have an active genital sore. If pregnant, don't forget to tell your healthcare provider early in the pregnancy.   In some cases, daily antiviral medicine (acyclovir, famcyclovir, or valavyclovir), in addition to consistent condom use, may reduce your chances of spreading herpes to an uninfected partner. Ask your healthcare provider if this medicine would be helpful for you.  [place] STD Hotline 519-214-7821 www.ashastd.org  Centers for Disease Control and Prevention ([phone] 591-0782 [employer]   Date Last Reviewed: 08/23/2015   2000-2019 [employer]. [address], [place] [address]. All rights reserved. This information is not intended as a substitute for professional medical care. Always follow your healthcare professional's instructions.        Living with Herpes  To speed healing, take care of open herpes sores. To reduce outbreaks, take care of your health. And to keep from infectingothers, learn how to avoid spreading thevirus.    To ease symptoms   Start episodic treatment at the first sign of symptoms, such as itching or tingling.  Take ibuprofen or acetaminophen to limit any pain.   Sit in a warm or cool bath or use a moist compress to lessen the itching of sores. For some women, genital outbreaks cause burning during urination. In such cases, urinating in a tub of warm water helps reduce burning.   Wear white cotton underwear and loose clothing during outbreaks. Don't wear nylon underwear or tight clothes. They can prevent sores from  healing.      To speed healing   Wash sores with mild soap and water. Pat (don't rub) the sores completely dry.   Always wash your hands after touching a sore.   Don't bandage sores. Air helps them heal.   Avoid using any ointment unless it is prescribed. Applying the wrong jelly or cream may hold in moisture and slow healing.   Don't pick at the sores. This can slow healing, and might cause a sore to become infected.   If you wear contacts, wash your hands well before putting them in.      To reduce outbreaks   Eat a balanced diet. Your health care provider may suggest taking supplements. These help ensure that you get all the nutrients you need.   Get plenty of sleep. This helps your immune system work its best.   Limit stress and tension. Both can weaken the body's defenses.   Limit exposure to sun, wind, and extreme heat or cold. Wear sunscreen and lip balm to help prevent outbreaks.      To protect others   Tell your current sex partner and any future partners that you have herpes. If you don't know what to say, ask your healthcare provider for help.   Use a latex condom that covers the affected areas each time you have sex. This reduces the risk of passing herpes to your partner.   Avoid kissing when you have an oral sore.   Do not have intercourse when genital sores are present. Also keep in mind, herpes can be passed during oral sex and with anal contact.   Don't share towels, toothbrushes, lip balm, or lipstick when you have a sore.   If you have very frequent outbreaks, taking daily antiviral medicines can help reduce the likelihood of transmission to your partner.    Date Last Reviewed: 09/23/2015   2000-2019 [employer]. [address] [place] [address]. All rights reserved. This information is not intended as a substitute for professional medical care. Always follow your healthcare professional's instructions.        Herpes: Treatment  Medicines can't cure  herpes. But they can help you feel better, and reduce the chances of passing herpes to others. Herpes medicines can control symptoms and shorten the duration of an outbreak (episodic therapy). Some herpes medicines can reduce the number of outbreaks (suppressive therapy). Yourhealthcare providerwill explain your options and any possible side effects.    How the medicines work  Antiviral medicines can prevent the herpes virus from copying itself and help reduce spreading it. Results may vary between people but they are generally helpful if given at the right times and used as directed. Take each medicine exactly as prescribed. Options include:   Primary treatment for the first outbreak. Medicine may be taken for up to 14 days. If needed, it may be taken longer.   Episodic therapy, for infrequent outbreaks. You take medicine for 5-7 days each time you notice symptoms. This can reduce your symptoms and the length of the  outbreak. It is important to start the medicine as soon as symptoms of a new outbreak appear.   Suppressive therapy, for frequent outbreaks. This daily medicine can reduce the number of outbreaks you have. In some cases, suppressive therapy prevents all outbreaks and greatly reduces the risk of giving the virus to others.  Types of medicines  There are several types of herpes medicines. Your options depend on how often you have symptoms and how severe they are.   Oral medicines come in pill form. These medicines are most commonly used to treat genital herpes.   Topical medicines come in ointment form. These can be used during outbreaks of oral herpes.   Intravenous (IV) medicines are sometimes used to treat severe herpes in infants, the elderly, or people with weak immune systems.  Date Last Reviewed: 09/23/2015   2000-2019 [employer]. [address], [place] [address]. All rights reserved. This information is not intended as a substitute for professional medical care.  Always follow your healthcare professional's instructions.    [facility]     Operated by [facility]  [place], [place] [address]  Phone: [phone]  Fax: [phone]  [email or web address]  Twitter [email or web address]   Closed all  Holidays        Attending Caregiver: Barbaraann Barthel, APRN,WHNP-BC    Today's orders:   Orders Placed This Encounter   . valACYclovir (VALTREX) 500 mg Oral Tablet         Prescription(s) E-Rx to:  [id number] [place], [place]    Future appts with Student [facility]  Visit date not found    [place]. THIS IS ONE EASY WAY TO COMMUNICATE WITH [facility].  SEE THE CODE ON THIS FORM FOR ACCESS.    -----------------------------PRIVACY INFORMATION-----------------------------------------------  As a [employer] student, regardless of your age, we cannot discuss your personal health information with a parent, spouse, family member or anyone else without your expressed consent.    This policy does not include:  Individuals who would have a legitimate reason to access your records and information to assist in your care under the provisions of HIPAA ([place] and [place]) law;   Individuals with whom you have previously given expressed written consent to do so, such a legal guardian or Power of Attorney.    No one can access your [facility], unless you give them your account sign on and password. You will receive emails from [place].  This means anyone who has access to the email account you provided can see this notification. There will be no private medical information included in these emails. This notification of  new medical information  available in your [facility], may be information that you do not want others to know.   _______________________________________________________________________    If your symptoms persist, worsen or you develop  any new or concerning symptoms please call [facility] for at [phone] for a follow up appointment.   If your symptoms are severe, go immediately to the emergency department or call 911.  Please check with your health insurance coverage for these types of visits.   Please keep all follow up visit recommended at today's visit.    If you received x-rays during your visit, be aware that the final and formal interpretation of those films by a radiologist will occur after your discharge.  If there is a significant discrepancy identified after your discharge, we will  contact you at the telephone number provided during registration.  These results are available for your review on [facility].    Please refer to your [facility] for lab test results. Lab test results related to HIV, STI's and pregnancy are considered private and are therefore not immediately visible in your [facility], we may still be able to send a generic MyChart message for these results.  If you have cultures pending for sexually transmitted diseases, you will be contacted by phone.     Positive cultures are reported to the [place] Department of Health, as required by state law. These results are considered private on [facility] and can only be released by the provider.We will not contact you if the results are negative.    It is very important that we have a phone number that is the single best way to contact you in the event that we become aware of important clinical information or concerns after your discharge.  If the phone number you provided at registration is NOT this number you should inform staff and registration prior to leaving.     Please call [facility] at [phone] with any further questions.    Instructions discussed with patient upon discharge by clinical staff with all questions answered.    Barbaraann Barthel, APRN,WHNP-BC 04/06/2018, 13:17

## 2018-04-07 NOTE — Progress Notes (Signed)
[facility], HEALTH/EDUCATION BLDG  [place] [address]    PATIENT NAME:  Barbara Graves  MRN:  [id number]  DOB:  [birth year]  DATE OF SERVICE: 04/06/2018    Chief Complaint   Patient presents with   . Medication follow up       History of Present Illness: Barbara Graves is a 22 y.o. female who presents to [facility] today for the above complaint. Is here for follow-up from last week for genital herpes. Mom is in room with her. States lesions are now all gone and feels so much better.   HPI        Past Medical History:    No past medical history on file.      Past Surgical History:    Past Surgical History:   Procedure Laterality Date   . HX NO SURGICAL PROCEDURES           Allergies:  No Known Allergies  Medications:  Outpatient Medications Marked as Taking for the 04/06/18 encounter (Office Visit) with Barbaraann Barthel, APRN,WHNP-BC   Medication Sig   . valACYclovir (VALTREX) 500 mg Oral Tablet Take 1 Tab (500 mg total) by mouth Twice daily     Social History:    Social History     Tobacco Use   . Smoking status: Never Smoker   . Smokeless tobacco: Never Used   Substance Use Topics   . Alcohol use: Never     Frequency: Never      Family History:  Family Medical History:     Problem Relation (Age of Onset)    No Known Problems Mother, Father, Maternal Grandmother, Maternal Grandfather, Paternal 44, Paternal Grandfather            Review of Systems:  Review of Systems   Genitourinary: Positive for genital sores.   All other systems reviewed and are negative.    Physical Exam:  Vitals:    04/06/18 1233   BP: 140/80   Pulse: 75   Resp: 18   Temp: 36.1 C (97 F)   TempSrc: Thermal Scan   SpO2: 99%   Weight: 53.5 kg (118 lb)   Height: 1.575 m (5\' 2" )   BMI: 21.63     Facility age limit for growth percentiles is 20 years.    Body mass index is 21.58 kg/m.  Physical Exam   Constitutional: She is oriented to person, place, and time. She  appears well-developed and well-nourished.   HENT:   Head: Normocephalic and atraumatic.   Eyes: Pupils are equal, round, and reactive to light. EOM are normal.   Neck: Normal range of motion.   Pulmonary/Chest: Effort normal.   Abdominal: Soft.   Musculoskeletal: Normal range of motion.   Neurological: She is alert and oriented to person, place, and time.   Skin: Skin is warm and dry.   Psychiatric: She has a normal mood and affect. Her behavior is normal. Judgment and thought content normal.     Ortho Exam    Data Reviewed:  No labs or x-rays performed today.    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 Follow up    3. Counseling for sexually transmitted disease      Plan:    Orders Placed This Encounter   . valACYclovir (VALTREX) 500 mg Oral Tablet     Answered all questions from pt and mom. Face to face  was 15-20 minutes  Discussed how to use episodic treatment, safe sex, transmission    Return if symptoms worsen or fail to improve.  Barbaraann Barthel, APRN,WHNP-BC  The co-signing faculty was physically present in Student health and available for consultation and did not particpate in the care of this patient.

## 2018-04-10 ENCOUNTER — Encounter (FREE_STANDING_LABORATORY_FACILITY): Payer: BC Managed Care – PPO | Admitting: Emergency Medicine

## 2018-04-10 ENCOUNTER — Encounter (FREE_STANDING_LABORATORY_FACILITY)
Admit: 2018-04-10 | Discharge: 2018-04-10 | Disposition: A | Discharge status: Home / Self Care | Payer: BC Managed Care – PPO | Attending: Emergency Medicine | Admitting: Emergency Medicine

## 2018-04-10 ENCOUNTER — Ambulatory Visit (INDEPENDENT_AMBULATORY_CARE_PROVIDER_SITE_OTHER): Payer: BC Managed Care – PPO

## 2018-04-10 ENCOUNTER — Encounter (INDEPENDENT_AMBULATORY_CARE_PROVIDER_SITE_OTHER): Payer: Self-pay

## 2018-04-10 VITALS — BP 119/77 | HR 76 | Temp 98.5°F | Resp 20 | Ht 62.0 in | Wt 116.8 lb

## 2018-04-10 DIAGNOSIS — R350 Frequency of micturition: Secondary | ICD-10-CM | POA: Insufficient documentation

## 2018-04-10 DIAGNOSIS — Z3202 Encounter for pregnancy test, result negative: Secondary | ICD-10-CM

## 2018-04-10 DIAGNOSIS — R300 Dysuria: Principal | ICD-10-CM

## 2018-04-10 DIAGNOSIS — Z6821 Body mass index (BMI) 21.0-21.9, adult: Secondary | ICD-10-CM

## 2018-04-10 MED ORDER — NITROFURANTOIN MONOHYDRATE/MACROCRYSTALS 100 MG CAPSULE: 100 mg | Cap | Freq: Two times a day (BID) | ORAL | 0 refills | 0 days | Status: AC

## 2018-04-10 NOTE — Patient Instructions (Addendum)
[facility]      Operated by [facility]  [address] [place], [place] [address]  Phone: [phone])  Fax: [phone]  [email or web address]  Open Daily 8:00a - 8:00p    Closed Thanksgiving and Christmas Day  [facility]     Operated by [facility]  [place] [address] [place], [place] [address]  Phone: [phone])  Fax: [phone]  [email or web address]  Open M-F  8:00a - 8:00p  Sat              10:00a - 4:00p  Sun             Closed  Closed all [place]        Attending Caregiver: Elayne Snare, MD      Today's orders:   Orders Placed This Encounter   . URINE CULTURE   . CANCELED: Urine RACK without Microscopy   . Urine Pregnancy   . nitrofurantoin (MACROBID) 100 mg Oral Capsule        Prescription(s) E-Rx to:  [id number] - [place], [address]    ________________________________________________________________________  Short Term Disability and [facility] does NOT provide assistance with any disability applications.  If you feel your medical condition requires you to be on disability, you will need to follow up with  your primary care physician or a specialist.  We apologize for any inconvenience.    For Medication Prescribed by [facility]:  As an Urgent Care facility, our clinic does NOT offer prescription refills over the telephone.    If you need more of the medication one of our medical providers prescribed, you will  either need to be re-evaluated by [place] or see your primary care physician.    ________________________________________________________________________      It is very important that we have a phone number.  This is the single best way to contact you in the event that we become aware of important clinical information or concerns after your discharge.  If the phone number you provided at  registration is NOT this number you should inform staff and registration prior to leaving.      Your treatment and evaluation today was focused on identifying and treating potentially emergent conditions based on your presenting signs, symptoms, and history.  The resulting initial clinical impression and treatment plan is not intended to be definitive or a substitute for a full physical examination and evaluation by your primary care provider.  If your symptoms persist, worsen, or you develop any new or concerning symptoms, you need to be evaluated.      If you received x-rays during your visit, be aware that the final and formal interpretation of those films by a radiologist may occur after your discharge.  If there is a significant discrepancy identified after your discharge, we will contact you at the telephone number provided at registration.      If you received a pelvic exam, you may have cultures pending for sexually transmitted diseases.  Positive cultures are reported to the [place] Department of Health as required by state law.  You may contact the Health Information Management Office of [facility] to get a copy of your results.     If you are over 24 year old, we cannot discuss your personal health information with a parent, spouse, family member, or anyone else  without your consent.  This does not include those who have legitimate access to your records and information to assist in your care under the provisions of HIPAA ([place] and [place]) law, or those to whom you have previously given written consent to do so, such a legal guardian or Power of [place].      Instructions are discussed with patient upon discharge by clinical staff with all questions answered.  Please call [facility] [phone]) if any further questions develop.  Go immediately to the emergency department if any concerns or worsening symptoms.      Elayne Snare, MD 04/10/2018, 11:32           Urinary Tract Infections in Women    Urinary tract infections (UTIs) are most often caused bybacteria. These bacteria enter the urinary tract. The bacteria may come from outside the body. Or they may travel from the skin outside therectum or vagina into the urethra. Female anatomy makes it easy for bacteria from the bowelto enter a woman's urinary tract, which is the most common source of UTI. This means women develop UTIs more often than men. Pain in or around the urinary tract is a common UTI symptom. But the only way to know for sure if you have a UTI for the healthcare provider to test your urine. The two tests that may be done are the urinalysis and urine culture.  Types of UTIs   Cystitis. A bladder infection (cystitis) is the most common UTI in women. You may have urgent or frequent urination. You may also havepain, burning when you urinate, and bloody urine.   Urethritis. This is an inflamed urethra, which is the tube that carries urine from the bladder to outside the body. You may have lower stomach or back pain. You may also have urgent or frequent urination.   Pyelonephritis. This is a kidney infection. If not treated, it can be serious and damage your kidneys. In severe cases, you may need to stay in thehospital. You may have a fever and lower back pain.  Medicines to treat a UTI  Most UTIs are treated with antibiotics. These kill the bacteria. The length of time you need to take them depends on the type of infection. It may be as short as 3 days. If you have repeated UTIs, you may need a low-dose antibioticfor several months. Take antibiotics exactly as directed. Don't stop taking them until all of the medicine is gone. If you stop taking the antibiotic too soon, the infection may not go away. You may also develop a resistance to the antibiotic. This can make it much harder to treat.  Lifestyle changes to treat and prevent UTIs  The lifestyle changes below will help get rid of your UTI.  They may also help prevent future UTIs.   Drink plenty of fluids. This includes water, juice, or other caffeine-free drinks. Fluids help flush bacteria out of your body.   Empty your bladder. Always empty your bladder when you feel the urge to urinate. And always urinate before going to sleep. Urine that stays in your bladder can lead to infection. Try to urinate before and after sex as well.   Practice good personal hygiene. Wipe yourself from front to back after using the toilet. This helps keep bacteria from getting into the urethra.   Use condoms during sex. These help prevent UTIs caused by sexually transmitted bacteria. Also don't use spermicides during sex. These can increase the risk for UTIs.  Choose other forms of birth control instead. For women who tend to get UTIs after sex, a low-dose of a preventive antibiotic may be used. Be sure to discuss this option with your healthcare provider.   Follow up with your healthcare provider as directed. He or she may test to make sure the infection has cleared. If needed, more treatment may be started.  Date Last Reviewed: 09/23/2015   2000-2019 [employer]. [address] [place] [address]. All rights reserved. This information is not intended as a substitute for professional medical care. Always follow your healthcare professional's instructions.

## 2018-04-10 NOTE — Progress Notes (Signed)
[facility], [facility]  [place], [place] [address]    PATIENT NAME:  Barbara Graves  MRN:  [id number]  DOB:  1996-02-05  DATE OF SERVICE:  04/10/2018    History of Present Illness:  Odessa Nishi is a 22 y.o. female who presents to Urgent Care today with chief complaint of:   Chief Complaint            Frequent Urination     Pain on Urination     Lower Back Pain         Location:  GU  Quality:  Urinary frequency, dysuria, reports hematuria.    Onset:  yesterday  Severity:  moderate  Timing:  Symptoms improved with AZO and after a dose of augmentin.    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    Modifying factors:  augmentin that she had left-over.  AZO.    Associated symptoms:  No fevers, no chills.  No flank pain       Functional Health Screening:    Patient is under 18:  No  Have you had a recent unexplained weight loss or gain?:  No  Because we are aware of abuse and domestic violence today, we ask all patients: Are you being hurt, hit, or frightened by anyone at your home or in your life?:  No  Do you have any basic needs within your home that are not being met? (such as Food, Shelter, Games developer, Transportation):  No  Patient is under 18 and therefore has no Advance Directives:  No  Patient has:  No Advance  Patient has Advance Directive:  No  Patient offered:  Refused Packet  Screening unable to be completed:  No       PHQ Questionnaire:     Fall Risk Assessment     I reviewed and confirmed the patient's past medical history taken by the nurse or medical assistant with the addition of the following:    Past Medical History:    History reviewed. No pertinent past medical history.  Past Surgical History:    Past Surgical History:   Procedure Laterality Date   . Hx no surgical procedures       Allergies:  No Known Allergies  Medications:    Current Outpatient Medications   Medication Sig   . fluconazole (DIFLUCAN) 150 mg  Oral Tablet Take 1 Tab (150 mg total) by mouth Every 3 days (Patient not taking: Reported on 04/10/2018)   . oral contraceptive (PATIENT'S OWN SUPPLY) Take 1 Tab by mouth Once a day   . valACYclovir (VALTREX) 1 gram Oral Tablet Take 1 Tab (1 g total) by mouth Twice daily for 10 days   . valACYclovir (VALTREX) 500 mg Oral Tablet Take 1 Tab (500 mg total) by mouth Twice daily     Social History:    Social History     Socioeconomic History   . Marital status: Single     Spouse name: Not on file   . Number of children: Not on file   . Years of education: Not on file   . Highest education level: Not on file   Occupational History   . Not on file   Social Needs   . Financial resource strain: Not on file   . Food insecurity:     Worry: Not on file     Inability: Not on file   .  Transportation needs:     Medical: Not on file     Non-medical: Not on file   Tobacco Use   . Smoking status: Never Smoker   . Smokeless tobacco: Never Used   Substance and Sexual Activity   . Alcohol use: Never     Frequency: Never   . Drug use: Never   . Sexual activity: Not on file   Lifestyle   . Physical activity:     Days per week: Not on file     Minutes per session: Not on file   . Stress: Not on file   Relationships   . Social connections:     Talks on phone: Not on file     Gets together: Not on file     Attends religious service: Not on file     Active member of club or organization: Not on file     Attends meetings of clubs or organizations: Not on file     Relationship status: Not on file   . Intimate partner violence:     Fear of current or ex partner: Not on file     Emotionally abused: Not on file     Physically abused: Not on file     Forced sexual activity: Not on file   Other Topics Concern   . Not on file   Social History Narrative   . Not on file     Family History:  No significant family history.  Family History   Problem Relation Age of Onset   . No Known Problems Mother    . No Known Problems Father    . No Known Problems  Maternal Grandmother    . No Known Problems Maternal Grandfather    . No Known Problems Paternal Grandmother    . No Known Problems Paternal Grandfather      Review of Systems:  Review of Systems   Constitutional: Negative for chills and fever.   HENT: Negative for congestion, ear pain, rhinorrhea and sore throat.    Eyes: Negative for pain and visual disturbance.   Respiratory: Negative for cough and shortness of breath.    Cardiovascular: Negative for chest pain.   Gastrointestinal: Negative for abdominal pain, constipation, diarrhea, nausea and vomiting.   Genitourinary: Positive for dysuria and frequency. Negative for flank pain, pelvic pain and vaginal discharge.   Musculoskeletal: Negative for arthralgias and myalgias.   Skin: Negative for rash and wound.   Neurological: Negative for dizziness, light-headedness and headaches.   Hematological: Negative for adenopathy.     Physical Exam:  Vital signs:   Vitals:    04/10/18 1045   BP: 119/77   Pulse: 76   Resp: 20   Temp: 36.9 C (98.5 F)   TempSrc: Tympanic   SpO2: 99%   Weight: 53 kg (116 lb 13.5 oz)   Height: 1.575 m ('5\' 2"' )   BMI: 21.42     Facility age limit for growth percentiles is 20 years.    Body mass index is 21.37 kg/m. Facility age limit for growth percentiles is 20 years.  No LMP recorded. (Menstrual status: Continuous Cycle OCP).    Physical Exam   Constitutional: She is oriented to person, place, and time. She appears well-developed and well-nourished. No distress.   HENT:   Head: Normocephalic and atraumatic.   Right Ear: External ear normal.   Left Ear: External ear normal.   Eyes: Conjunctivae are normal. Right eye exhibits no discharge. Left eye  exhibits no discharge.   Neck: Normal range of motion.   Cardiovascular: Normal rate, regular rhythm and normal heart sounds. Exam reveals no gallop and no friction rub.   No murmur heard.  Pulmonary/Chest: Effort normal. No stridor. No respiratory distress. She has no wheezes.   Abdominal: Soft.  Bowel sounds are normal. She exhibits no distension. There is no tenderness. There is no guarding and no CVA tenderness.   Musculoskeletal: Normal range of motion. She exhibits no edema or tenderness.   Neurological: She is alert and oriented to person, place, and time.   Skin: Skin is warm and dry. She is not diaphoretic.   Psychiatric: She has a normal mood and affect. Her behavior is normal.   Nursing note and vitals reviewed.     Data Reviewed:    Point-of-care testing:     Urine Pregnancy: Negative                      Course:  Condition at discharge:  Good     Differential Diagnosis:  UTI, pyelo, STD, IC.     Assessment:   1. Dysuria    2. Urinary frequency      Plan:    Orders Placed This Encounter   . URINE CULTURE   . CANCELED: Urine RACK without Microscopy   . Urine Pregnancy   . nitrofurantoin (MACROBID) 100 mg Oral Capsule      Start Macrobid.  Was advised to not take "leftover" abx in the future as it could affect the urine culture results.       Advised patient/patient's guardian(s) to go to the Emergency Department immediately for further work up, if any concerning symptoms.  If symptoms are worsening or not improving, the patient should return to Urgent Care for further evaluation.  Plan was discussed and patient/patient's guardian(s) verbalized understanding.      Donnella Sham Greggory Stallion, MD, FAAFP   04/10/2018, 11:36  Attending Physician, Urgent Care & Student Health  Assistant Professor, Emergency Medicine   Department of Emergency Medicine   [facility]

## 2018-05-04 ENCOUNTER — Encounter (INDEPENDENT_AMBULATORY_CARE_PROVIDER_SITE_OTHER): Payer: Self-pay

## 2018-05-04 ENCOUNTER — Ambulatory Visit (INDEPENDENT_AMBULATORY_CARE_PROVIDER_SITE_OTHER): Payer: BC Managed Care – PPO

## 2018-05-04 VITALS — BP 104/79 | HR 73 | Temp 98.1°F | Resp 18 | Ht 62.0 in | Wt 119.0 lb

## 2018-05-04 DIAGNOSIS — D1800 Hemangioma unspecified site: Secondary | ICD-10-CM

## 2018-05-04 DIAGNOSIS — D1801 Hemangioma of skin and subcutaneous tissue: Secondary | ICD-10-CM

## 2018-05-04 NOTE — Patient Instructions (Addendum)
[facility]     [facility]ated by [facility]  [place], [place] [address]  Phone: [phone]  Fax: [phone]  [email or web address]  Twitter [email or web address]   Closed all  Holidays        Attending Caregiver: [facility] Provider [facility]    Today's orders: No orders of the defined types were placed in this encounter.        Prescription(s) E-Rx to:  [id number] - [place], [place]    Future appts with Student Health  Visit date not found    [place]. THIS IS ONE EASY WAY TO COMMUNICATE WITH [facility].  SEE THE CODE ON THIS FORM FOR ACCESS.    -----------------------------PRIVACY INFORMATION-----------------------------------------------  As a [employer] student, regardless of your age, we cannot discuss your personal health information with a parent, spouse, family member or anyone else without your expressed consent.    This policy does not include:  Individuals who would have a legitimate reason to access your records and information to assist in your care under the provisions of HIPAA ([place] and [place]) law;   Individuals with whom you have previously given expressed written consent to do so, such a legal guardian or Power of Attorney.    No one can access your [facility], unless you give them your account sign on and password. You will receive emails from [facility].  This means anyone who has access to the email account you provided can see this notification. There will be no private medical information included in these emails. This notification of  new medical information  available in your [facility], may be information that you do not want others to know.   _______________________________________________________________________    If your symptoms persist, worsen or you develop any new or concerning symptoms please call [facility] for at [phone]  for a follow up appointment.   If your symptoms are severe, go immediately to the emergency department or call 911.  Please check with your health insurance coverage for these types of visits.   Please keep all follow up visit recommended at today's visit.    If you received x-rays during your visit, be aware that the final and formal interpretation of those films by a radiologist will occur after your discharge.  If there is a significant discrepancy identified after your discharge, we will contact you at the telephone number provided during registration.  These results are available for your review on [facility].    Please refer to your [facility] for lab test results. Lab test results related to HIV, STI's and pregnancy are considered private and are therefore not immediately visible in your [facility], we may still be able to send a generic MyChart message for these results.  If you have cultures pending for sexually transmitted diseases, you will be contacted by phone.     Positive cultures are reported to the [place] Department of Health, as required by state law. These results are considered private on [facility] and can only be released by the provider.We will not contact you if the results are negative.    It is very important that we have a phone number that is the single best way to contact you in the event that we become aware of important clinical information or concerns after your discharge.  If the phone number you provided at registration is NOT this number you should inform staff and registration prior to leaving.  Please call [facility] at [phone] with any further questions.    Instructions discussed with patient upon discharge by clinical staff with all questions answered.    Elayne Snare, MD 05/04/2018, 12:54          Pyogenic Granuloma  Pyogenic granuloma is an overgrowth of blood vessels on the skin in response to an injury. It is red and moist and may bleed easily. It is not  cancerous. It can be treated by surgical removal or cauterization (chemical or electric treatment that shrinks and seals the tissue). It takes about 1 week for the wound to heal after treatment. A pyogenic granuloma may regrow after treatment. These are most common in children and pregnant women.  Home care  The following guidelines can help you care for yourself at home:   Unless told otherwise, you should change your dressing once a day. If the bandage sticks, soak it off in warm water.   Wash the area with soap and water to remove all cream, ointment, drainage, or scab. Use a wet cotton swab to loosen and remove any blood or crust that forms. You may do this in a sink, under a tub faucet, or in the shower. Rinse off the soap and pat dry with a clean towel. Look for signs of infection.   Reapply cream/ointment to prevent infection and keep the bandage from sticking.   Cover thearea with a nonstick gauze. Then wrap it with the bandage material.   If the bandage becomes wet or soiled, change it as soon as possible.   You may use over-the-counter pain medicine such as acetaminophen or ibuprofen to control pain, unless another medicine was prescribed. If you have chronic liver or kidney disease or ever had a stomach ulcer or gastrointestinal bleeding, talk with your doctor before using these medicines. Do not use ibuprofen in children under 21 months of age.  Follow-up care  Follow up with your doctor or as advised by our staff. Sometimes an infection may occur after any surgical procedure. Therefore, look closely at your wound in 2 days for the signs of infection listed below.  When to seek medical advice  Call your healthcare provider right away if any of these occur:   Increasing pain in the wound   Increasing redness, swelling, or pus coming from the wound   Red streaks in your skin coming from the wound   Fever of 100.25F (38C) or higher, or as directed by your healthcare provider  Pyogenic granulomas  may reoccur after treatment. If this happens, you may need to follow up with your healthcare provider for additional treatment.  Date Last Reviewed: 05/24/2015   2000-2019 [employer]. [address] [place] [address]. All rights reserved. This information is not intended as a substitute for professional medical care. Always follow your healthcare professional's instructions.

## 2018-05-04 NOTE — Progress Notes (Signed)
[facility]  [address]  [place], [place] [address]    PATIENT NAME:  Barbara Graves  MRN:  [id number]  DOB:  [birth year]  DATE OF SERVICE:  05/04/2018    History of Present Illness:  Barbara Graves is a 22 y.o. female who presents to [facility] today with chief complaint of:   Chief Complaint            Mole - Changing left thigh x 1 month         Location:  L thigh  Quality:  Lesion  Onset:  ~6 weeks ago  Severity:  Mild  Timing:  Constant  Context:  Pt reports that this mole has been changing and has concerned her. She sts that she is otherwise healthy with no concerning medical history. Pt reports that the lesion hasn't bothered her just the fact that it was growing concerned her and made her feel as though she should have it evaluated.  Modifying factors:  She sts that last weekend she poked it with a needle which caused the expression of blood, however the lesion did not change in size afterwards.  Associated symptoms:  None       Functional Health Screening:    Patient is under 18:  No  Have you had a recent unexplained weight loss or gain?:  No  Because we are aware of abuse and domestic violence today, we ask all patients: Are you being hurt, hit, or frightened by anyone at your home or in your life?:  No  Do you have any basic needs within your home that are not being met? (such as Food, Shelter, Games developer, Transportation):  No  Patient is under 18 and therefore has no Advance Directives:  No  Patient has:  No Advance  Patient has Advance Directive:  No  Patient offered:  Refused Packet  Screening unable to be completed:  No       PHQ Questionnaire:        Fall Risk Assessment        I reviewed and confirmed the patient's past medical history taken by the nurse or medical assistant with the addition of the following:    Past Medical History:    History reviewed. No pertinent past medical history.     Past Surgical History:    Past  Surgical History:   Procedure Laterality Date   . Hx no surgical procedures       Allergies:  No Known Allergies     Medications:    Current Outpatient Medications   Medication Sig   . fluconazole (DIFLUCAN) 150 mg Oral Tablet Take 1 Tab (150 mg total) by mouth Every 3 days (Patient not taking: Reported on 04/10/2018)   . oral contraceptive (PATIENT'S OWN SUPPLY) Take 1 Tab by mouth Once a day   . valACYclovir (VALTREX) 500 mg Oral Tablet Take 1 Tab (500 mg total) by mouth Twice daily     Social History:    Social History     Socioeconomic History   . Marital status: Single     Spouse name: Not on file   . Number of children: Not on file   . Years of education: Not on file   . Highest education level: Not on file   Occupational History   . Not on file   Social Needs   . Financial resource strain: Not on file   .  Food insecurity:     Worry: Not on file     Inability: Not on file   . Transportation needs:     Medical: Not on file     Non-medical: Not on file   Tobacco Use   . Smoking status: Never Smoker   . Smokeless tobacco: Never Used   Substance and Sexual Activity   . Alcohol use: Never     Frequency: Never   . Drug use: Never   . Sexual activity: Not on file   Lifestyle   . Physical activity:     Days per week: Not on file     Minutes per session: Not on file   . Stress: Not on file   Relationships   . Social connections:     Talks on phone: Not on file     Gets together: Not on file     Attends religious service: Not on file     Active member of club or organization: Not on file     Attends meetings of clubs or organizations: Not on file     Relationship status: Not on file   . Intimate partner violence:     Fear of current or ex partner: Not on file     Emotionally abused: Not on file     Physically abused: Not on file     Forced sexual activity: Not on file   Other Topics Concern   . Not on file   Social History Narrative   . Not on file     Family History:  No significant family history.  Family History      Problem Relation Age of Onset   . No Known Problems Mother    . No Known Problems Father    . No Known Problems Maternal Grandmother    . No Known Problems Maternal Grandfather    . No Known Problems Paternal Grandmother    . No Known Problems Paternal Grandfather      Review of Systems:  Review of Systems   Constitutional: Negative for chills and fever.   HENT: Negative for congestion, ear pain, rhinorrhea and sore throat.    Eyes: Negative for pain and visual disturbance.   Respiratory: Negative for cough and shortness of breath.    Cardiovascular: Negative for chest pain.   Gastrointestinal: Negative for abdominal pain, constipation, diarrhea, nausea and vomiting.   Genitourinary: Negative.    Musculoskeletal: Negative for arthralgias and myalgias.   Skin: Negative for rash and wound.        L thigh lesion   Neurological: Negative for dizziness, light-headedness and headaches.   Hematological: Negative for adenopathy.     Physical Exam:  Vital signs:   Vitals:    05/04/18 1237   BP: 104/79   Pulse: 73   Resp: 18   Temp: 36.7 C (98.1 F)   TempSrc: Tympanic   SpO2: 99%   Weight: 54 kg (119 lb)   Height: 1.575 m ('5\' 2"' )   BMI: 21.81     Facility age limit for growth percentiles is 20 years.    Body mass index is 21.77 kg/m. Facility age limit for growth percentiles is 20 years.  Patient's last menstrual period was 05/04/2018 (within months).    Physical Exam   Constitutional: She is oriented to person, place, and time. She appears well-developed and well-nourished. No distress.   HENT:   Head: Normocephalic and atraumatic.   Right Ear: External ear normal.  Left Ear: External ear normal.   Eyes: Conjunctivae are normal. Right eye exhibits no discharge. Left eye exhibits no discharge.   Neck: Normal range of motion.   Cardiovascular: Normal rate.   Pulmonary/Chest: Effort normal. No stridor. No respiratory distress.   Musculoskeletal: Normal range of motion. She exhibits no edema or tenderness.   Neurological:  She is alert and oriented to person, place, and time.   Skin: Skin is warm and dry. She is not diaphoretic.        Psychiatric: She has a normal mood and affect. Her behavior is normal.   Nursing note and vitals reviewed.        Data Reviewed:  Not applicable    Course:  Condition at discharge:  Good     Differential Diagnosis: hemangioma vs pyogenic granuloma     Assessment:   1. Hemangioma      Plan:    Orders Placed This Encounter   . Referral to Dermatology         Placed referral for follow up with Dermatology.    Advised patient/patient's guardian(s) to go to the Emergency Department immediately for further work up, if any concerning symptoms.  If symptoms are worsening or not improving, the patient should return to Student [facility] for further evaluation.  Plan was discussed and patient/patient's guardian(s) verbalized understanding.      I am scribing for, and in the presence of, Dr. Greggory Stallion for services provided on 05/04/2018.  Renaldo Fiddler, SCRIBE     I personally performed the services described in this documentation, as scribed  in my presence, and it is both accurate  and complete.  Donnella Sham Greggory Stallion, MD  05/04/2018, 19:48  Attending Physician, Urgent Care & Student Health  Assistant Professor, Emergency Medicine   Department of Emergency Medicine   [facility]

## 2018-05-12 ENCOUNTER — Encounter (INDEPENDENT_AMBULATORY_CARE_PROVIDER_SITE_OTHER): Payer: Self-pay

## 2018-05-12 ENCOUNTER — Ambulatory Visit (INDEPENDENT_AMBULATORY_CARE_PROVIDER_SITE_OTHER): Payer: BC Managed Care – PPO

## 2018-05-12 VITALS — BP 131/85 | HR 80 | Temp 99.0°F | Resp 14 | Ht 62.0 in | Wt 122.5 lb

## 2018-05-12 DIAGNOSIS — J029 Acute pharyngitis, unspecified: Principal | ICD-10-CM

## 2018-05-12 DIAGNOSIS — Z6822 Body mass index (BMI) 22.0-22.9, adult: Secondary | ICD-10-CM

## 2018-05-12 NOTE — Patient Instructions (Addendum)
[place]     Operated by [facility]  [place], [place] [address]  Phone: [phone]  Fax: [phone]  [email or web address]  Twitter [email or web address]   Closed all  Holidays        Attending Caregiver: [facility]    Today's orders: No orders of the defined types were placed in this encounter.        Prescription(s) E-Rx to:  [id number] [place], [place]    Future appts with [facility]  Visit date not found    [place]. THIS IS ONE EASY WAY TO COMMUNICATE WITH [facility].  SEE THE CODE ON THIS FORM FOR ACCESS.    -----------------------------PRIVACY INFORMATION-----------------------------------------------  As a [employer] student, regardless of your age, we cannot discuss your personal health information with a parent, spouse, family member or anyone else without your expressed consent.    This policy does not include:  Individuals who would have a legitimate reason to access your records and information to assist in your care under the provisions of HIPAA ([place] and [place]) law;   Individuals with whom you have previously given expressed written consent to do so, such a legal guardian or Power of Attorney.    No one can access your [facility], unless you give them your account sign on and password. You will receive emails from [facility].  This means anyone who has access to the email account you provided can see this notification. There will be no private medical information included in these emails. This notification of  new medical information  available in your [facility], may be information that you do not want others to know.   _______________________________________________________________________    If your symptoms persist, worsen or you develop any new or concerning symptoms please call [facility] for at [phone] for  a follow up appointment.   If your symptoms are severe, go immediately to the emergency department or call 911.  Please check with your health insurance coverage for these types of visits.   Please keep all follow up visit recommended at today's visit.    If you received x-rays during your visit, be aware that the final and formal interpretation of those films by a radiologist will occur after your discharge.  If there is a significant discrepancy identified after your discharge, we will contact you at the telephone number provided during registration.  These results are available for your review on [facility].    Please refer to your [facility] for lab test results. Lab test results related to HIV, STI's and pregnancy are considered private and are therefore not immediately visible in your [facility], we may still be able to send a generic MyChart message for these results.  If you have cultures pending for sexually transmitted diseases, you will be contacted by phone.     Positive cultures are reported to the [place] Department of Health, as required by state law. These results are considered private on [facility] and can only be released by the provider.We will not contact you if the results are negative.    It is very important that we have a phone number that is the single best way to contact you in the event that we become aware of important clinical information or concerns after your discharge.  If the phone number you provided at registration is NOT this number you should inform staff and registration prior to leaving.  Please call [facility] at [phone] with any further questions.    Instructions discussed with patient upon discharge by clinical staff with all questions answered.    Raleigh Callas, MD 05/12/2018, 16:26        Viral Upper Respiratory Illness (Adult)    You have a viral upper respiratory illness (URI), which is another term for the common cold. This illness is contagious during the  first few days. It is spread through the air by coughing and sneezing. It may also be spread by direct contact (touching the sick person and then touching your own eyes, nose, or mouth). Frequent handwashing will decrease risk of spread. Most viral illnesses go away within 7 to 10 days with rest and simple home remedies. Sometimes the illness may last for several weeks. Antibiotics will not kill a virus, and they are generally not prescribed for this condition.  Home care   If symptoms are severe, rest at home for the first 2 to 3 days. When you resume activity, don't let yourself get too tired.   Don't smoke. If you need help stopping, talk with your healthcare provider.   Avoid being exposed to cigarette smoke (yours or others').   You may use acetaminophen or ibuprofen to control pain and fever, unless another medicine was prescribed.If you have chronic liver or kidney disease, have ever had a stomach ulcer or gastrointestinal bleeding, or are taking blood-thinning medicines, talk with your healthcare provider before using these medicines. Aspirin should never be given to anyone under 54 years of age who is ill with a viral infection or fever. It may cause severe liver or brain damage.   Your appetite may be poor, so a light diet is fine. Stay well hydrated by drinking 6 to 8 glasses of fluids per day (water, soft drinks, juices, tea, or soup). Extra fluids will help loosen secretions in the nose and lungs.   Over-the-counter cold medicines will not shorten the length of time you're sick, but they may be helpful for the following symptoms: cough, sore throat, and nasal and sinus congestion. If you take prescription medicines, ask your healthcare provider or pharmacist which over-the-counter medicines are safe to use. (Note: Don't use decongestants if you have high blood pressure.)  Follow-up care  Follow up with your healthcare provider, or as advised.  When to seek medical advice  Call your healthcare  provider right away if any of these occur:   Cough with lots of colored sputum (mucus)   Severe headache; face, neck, or ear pain   Difficultyswallowingdue to throat pain   Fever of 100.33F (38C) or higher, or as directed by your healthcare provider  Call 911  Call 911 if any of these occur:   Chest pain, shortness of breath, wheezing, or difficulty breathing   Coughing up blood   Very severe pain with swallowing, especially if it goes along with a muffled voice   Date Last Reviewed: 02/20/2017   2000-2019 [employer]. [address], [place] [address]. All rights reserved. This information is not intended as a substitute for professional medical care. Always follow your healthcare professional's instructions.

## 2018-05-12 NOTE — Progress Notes (Signed)
History of Present Illness: Barbara Graves is a 22 y.o. female who presents to the [facility] today with chief complaint of    Chief Complaint            Sore Throat         Pt presents to [facility] with c/o sore throat onset 2 days ago.She also noted rhinorrhea and nasal congestions starting about 4 days ago. She sts that she has been using Claritin and Flonase which has provided no relief. Pt reports that she has had no known sick contacts with similar sxs recently. She sts that her sxs have been worsening since onset. Pt reports that she has noticed her sxs tend to be worse at night time. She sts that she is otherwise healthy and the only daily medications she takes is birth control. Pt reports that she has no known drug allergies. She associates night sweats, fatigue, sinus congestion, dry cough secondary to sore throat and nausea. Pt denies fever, vomiting and abdominal pain. Of note, patient reports that she was sick with similar sxs a few weeks ago, but was very busy and couldn't make into clinic to be evaluated and her sxs at that time completely resolved on their own. She sts that she has a Hx of mononucleosis.         Functional Health Screening:    Patient is under 18:  No  Have you had a recent unexplained weight loss or gain?:  No  Because we are aware of abuse and domestic violence today, we ask all patients: Are you being hurt, hit, or frightened by anyone at your home or in your life?:  No  Do you have any basic needs within your home that are not being met? (such as Food, Shelter, Games developer, Transportation):  No  Patient is under 18 and therefore has no Advance Directives:  No  Patient has:  No Advance  Patient has Advance Directive:  No  Patient offered:  Refused Packet  Screening unable to be completed:  No       I reviewed and confirmed the patient's past medical history taken by the nurse or medical assistant with the addition of the following:    Past Medical History:    History  reviewed. No pertinent past medical history.    Past Surgical History:    Past Surgical History:   Procedure Laterality Date   . HX NO SURGICAL PROCEDURES       Allergies:  No Known Allergies     Medications:    Current Outpatient Medications   Medication Sig   . fluconazole (DIFLUCAN) 150 mg Oral Tablet Take 1 Tab (150 mg total) by mouth Every 3 days (Patient not taking: Reported on 04/10/2018)   . oral contraceptive (PATIENT'S OWN SUPPLY) Take 1 Tab by mouth Once a day   . valACYclovir (VALTREX) 500 mg Oral Tablet Take 1 Tab (500 mg total) by mouth Twice daily     Social History:    Social History     Tobacco Use   . Smoking status: Never Smoker   . Smokeless tobacco: Never Used   Substance Use Topics   . Alcohol use: Never     Frequency: Never   . Drug use: Never     Family History: No significant family history.  Family Medical History:     Problem Relation (Age of Onset)    No Known Problems Mother, Father, Maternal Grandmother, Maternal Grandfather, Paternal 83, Paternal Grandfather  Review of Systems:    General: night sweats and no fever  ENT:  sore throat and sinus congestion  Pulmonary:   dry cough secondary to sore throat  Gastrointestinal:  nausea, no vomiting and no abdominal pain  All other review of systems were negative    Physical Exam:  Vital signs:   Vitals:    05/12/18 1615   BP: 131/85   Pulse: 80   Resp: 14   Temp: 37.2 C (99 F)   TempSrc: Thermal Scan   SpO2: 98%   Weight: 55.6 kg (122 lb 8 oz)   Height: 1.575 m ('5\' 2"' )   BMI: 22.45     Facility age limit for growth percentiles is 20 years.    Body mass index is 22.41 kg/m. Facility age limit for growth percentiles is 20 years.  Patient's last menstrual period was 05/04/2018 (within months).    General:  Well appearing and no acute distress  Head:  Normocephalic  Eyes:  Normal lids/lashes and normal conjunctiva  ENT:  Normal EAC's, normal TM's, thick discharge of nasal turbinates bilaterally, MMM, cobblestoning appearance of  pharynx with erythema, no tonsillar enlargement or exudate present and normal tongue/uvula  Neck:  Supple  Pulmonary:  Clear to auscultation bilaterally, no wheezes, no rales and no rhonchi  Cardiovascular:  Regular rate/rhythm, normal S1/S2 and no murmur/rub/gallop  Gastrointestinal:  Non-distended, soft, non-tender, no rebound and no guarding  Skin:  Warm/dry and no rash  Psychiatric:  Appropriate affect and behavior  Neurologic:   Alert and oriented x 3  Hem/Lymph:  No cervical lymphadenopathy     Data Reviewed:    Point-of-care testing:       Rapid Strep: Negative                      Course: Condition at discharge: Good     Differential Diagnosis: URI vs viral pharyngitis vs strep pharyngitis     Assessment:   1. Sore throat      Plan:    Orders Placed This Encounter   . POCT RAPID STREP A         POCT rapid strep obtained in clinic which yielded negative results, discussed this with patient.  Educated patient on likelihood of viral etiology of illness.  Discussed symptomatic management with OTC medications and warm salt water gargles.  Ensure adequate hydration and rest.    Go to Emergency Department immediately for further work up if any concerning symptoms.  Plan was discussed and patient verbalized understanding. If symptoms are worsening or not improving the patient should return to the [facility] for further evaluation.    I am scribing for, and in the presence of, Dr. Governor Specking for services provided on 05/12/2018.  Pleas Patricia, SCRIBE     Adam Gorden Harms, SCRIBE 05/12/2018, 16:30     I personally performed the services described in this documentation, as scribed  in my presence, and it is both accurate  and complete.    Raleigh Callas, MD

## 2018-05-25 ENCOUNTER — Other Ambulatory Visit (HOSPITAL_BASED_OUTPATIENT_CLINIC_OR_DEPARTMENT_OTHER): Payer: Self-pay | Admitting: Dermatology

## 2018-05-25 ENCOUNTER — Ambulatory Visit
Payer: BC Managed Care – PPO | Attending: Dermatology | Admitting: Student in an Organized Health Care Education/Training Program

## 2018-05-25 ENCOUNTER — Ambulatory Visit (HOSPITAL_BASED_OUTPATIENT_CLINIC_OR_DEPARTMENT_OTHER): Payer: BC Managed Care – PPO

## 2018-05-25 VITALS — Temp 96.8°F | Ht 62.68 in | Wt 118.4 lb

## 2018-05-25 DIAGNOSIS — D485 Neoplasm of uncertain behavior of skin: Secondary | ICD-10-CM

## 2018-05-25 DIAGNOSIS — L980 Pyogenic granuloma: Secondary | ICD-10-CM | POA: Insufficient documentation

## 2018-05-25 NOTE — Progress Notes (Signed)
Dermatology Clinic, [facility]  [place] [address]  [phone]    Date:   05/25/2018  Name: Barbara Graves  Age: 22 y.o.    Chief complaint: Skin Check    HPI  Barbara Graves is a 22 y.o. female with no personal history of skin cancer or known family history of skin cancer who presents for evaluation of growing red lesion on upper L leg. She states she noticed this lesion about 3 months ago. It started as flat and has been slowly growing outwards over the past 3 months. Denies any spontaneous bleeding, itching, or pain. One time she attempted to drain lesion with a needle and it bled a lot. No other similar lesions. This is the first lesion like this she's had. No other new, changing, bleeding, or symptomatic lesions or moles. No other skin concerns.    Review of Systems   Constitutional: Negative for chills and fever.   Skin: Negative for itching and rash.     Current Medications  . fluconazole (DIFLUCAN) 150 mg Oral Tablet Take 1 Tab (150 mg total) by mouth Every 3 days (Patient not taking: Reported on 04/10/2018)   . oral contraceptive (PATIENT'S OWN SUPPLY) Take 1 Tab by mouth Once a day   . valACYclovir (VALTREX) 500 mg Oral Tablet Take 1 Tab (500 mg total) by mouth Twice daily     No Known Allergies  No past medical history on file.  Past Medical History was reviewed and is negative for other skin conditions    Physical Exam  Vitals: Temperature 36 C (96.8 F), temperature source Thermal Scan, height 1.592 m (5' 2.68"), weight 53.7 kg (118 lb 6.2 oz), last menstrual period 05/04/2018.  Physical Exam   Constitutional: She appears well-developed and well-nourished.   Skin: Skin is warm and dry.          Assessment and Plan  Problem List Items Addressed This Visit     None      Visit Diagnoses     Neoplasm of uncertain behavior of skin    -  Primary    Relevant Orders    Pathology, Initial Surgical Specimen        1. Neoplasm of uncertain behavior (see  #1 on body diagram)  Suspect pyogenic granuloma vs angioma  Procedure: Shave biopsy  - TIME OUT: A time out was performed to confirm the correct patient, procedure, and site. Consent obtained, area cleaned, and anesthetized. A shave removal/saucerization with intent to excise entire lesion was performed.  The base of the lesion was cauterized. Vaseline and bandage were placed over the wound and wound care instructions were given. Patient demonstrated understanding of the instructions. Patient was advised that it would take approximately 1-2 weeks for the pathology results to be available and that I will personally contact the patient at the number provided to further discuss the pathology results.    Return to clinic as needed or with any new or changing skin lesions.    Rosetta Posner, MD  05/25/2018, 13:17          I saw and examined the patient.  I was present and supervised the entire procedure.  I reviewed the resident's note.  I agree with the findings and plan of care as documented in the resident's note.  Any exceptions/additions are edited/noted.    Dairl Ponder, MD 05/31/2018, 13:59

## 2018-05-25 NOTE — Nursing Note (Signed)
05/25/18 1100   Medication Administration   Initials sc   Medication  Lidocaine with Epi   Medication Dose 1 cc   Route of Administration Intradermal   NDC # [id number]   LOT # [id number]   Expiration date 01/21/20   [employer]urer [employer]   Clinic Supplied Yes   Patient Supplied No   Comments: Administered by Dr. Arnoldo Morale

## 2018-05-26 LAB — HISTORICAL SURGICAL PATHOLOGY SPECIMEN

## 2018-06-24 ENCOUNTER — Ambulatory Visit (INDEPENDENT_AMBULATORY_CARE_PROVIDER_SITE_OTHER): Payer: BC Managed Care – PPO | Admitting: Emergency Medicine

## 2018-06-24 ENCOUNTER — Encounter (INDEPENDENT_AMBULATORY_CARE_PROVIDER_SITE_OTHER): Payer: Self-pay | Admitting: Emergency Medicine

## 2018-06-24 VITALS — BP 110/77 | HR 70 | Temp 98.0°F | Resp 16 | Ht 62.0 in | Wt 114.4 lb

## 2018-06-24 DIAGNOSIS — Z6820 Body mass index (BMI) 20.0-20.9, adult: Secondary | ICD-10-CM

## 2018-06-24 DIAGNOSIS — R41840 Attention and concentration deficit: Principal | ICD-10-CM

## 2018-06-24 NOTE — Patient Instructions (Signed)
What is ADHD?  Does your child have trouble sitting still or paying attention? You may have been told that ADHD (attention deficit hyperactivity disorder) may be the cause. A child with ADHD might have a hard time staying focused (attention deficit). He or she may also have trouble controlling impulses (hyperactivity disorder). A child with one or both of these problems struggles daily to perform and behave well. ADHD is no one's fault. But if left untreated, it can deprive a child of self-esteem, new relationships. and limit success.    Which of the following describe your child?  These are some of the symptoms of ADHD:  Attention deficit   Lacks mental focus   Performs inconsistently   Is distracted easily   Has trouble shifting between tasks or settings   Is messy, or loses things   Forgets    Hyperactive/impulsive   Has trouble controlling impulses; might talk too much, interrupt, or have a hard time taking turns   Is easy to upset or anger   Is always moving (sometimes without purpose)   Does not learn from mistakes    What happens in the brain?  The brain controls your body, thoughts, and feelings. It does so with the help of neurotransmitters. These chemicals help the brain send and receive messages. With ADHD, the level of these chemicals often varies. This may cause signs of ADHD to come and go.  When messages are not received  With ADHD, chemicals in certain parts of the brain can be in short supply. Because of this, some messages do not travel between nerve cells. Messages that signal a person to control behavior or pay attention aren't passed along. As a result, traits common to ADHD may occur.  Remember your child's strengths  Children with ADHD can be challenging to raise. Because of this, it's easy to overlook their good traits. What's special about your child? Do your best to value and support your child's unique talents, strengths, and interests. To nurture and support your child's  self-esteem, share your positive thoughts and feelings with your child as often as possible.  Date Last Reviewed: 08/23/2015   2000-2019 [employer]. [address] [place] [address]. All rights reserved. This information is not intended as a substitute for professional medical care. Always follow your healthcare professional's instructions.

## 2018-06-24 NOTE — Progress Notes (Signed)
[facility]  [address]  [place], [place] [address]    PATIENT NAME:  Barbara Graves  MRN:  [id number]  DOB:  1996/09/14  DATE OF SERVICE:  06/24/2018    History of Present Illness:  Barbara Graves is a 22 y.o. female who presents to [facility] today with chief complaint of:   Chief Complaint            Concentration Problems         Has been having issues with concentration and getting her work done and doing what she needs to do.  Her grades have been starting to slip as a result.      Started doing therapy thinking that there was an anxiety component involved.  But she hasn't addressed the issue of concentration with her therapists.     Has had teachers and her family point it out to her as well.    She has a problem concentrating on her exam, has had teachers even offer for her to take exams in a private room.  But says she says it's too embarrassing for her to take them up on their offer.      A lot of her symptoms are most apparent around tests, but there are issues outside of that experience.    Says she easily distracted by every day things on a daily basis but she's more acutely aware of it around her academics because now her grades are slipping.           Functional Health Screening:    Patient is under 18:  No  Have you had a recent unexplained weight loss or gain?:  No  Because we are aware of abuse and domestic violence today, we ask all patients: Are you being hurt, hit, or frightened by anyone at your home or in your life?:  No  Do you have any basic needs within your home that are not being met? (such as Food, Shelter, Games developer, Transportation):  No  Patient is under 18 and therefore has no Advance Directives:  No  Patient has:  No Advance  Patient has Advance Directive:  No  Patient offered:  Refused Packet  Screening unable to be completed:  No       PHQ Questionnaire:     Fall Risk Assessment     I reviewed and  confirmed the patient's past medical history taken by the nurse or medical assistant with the addition of the following:    Past Medical History:    History reviewed. No pertinent past medical history.  Past Surgical History:    Past Surgical History:   Procedure Laterality Date   . Hx no surgical procedures       Allergies:  No Known Allergies  Medications:    Current Outpatient Medications   Medication Sig   . fluconazole (DIFLUCAN) 150 mg Oral Tablet Take 1 Tab (150 mg total) by mouth Every 3 days (Patient not taking: Reported on 04/10/2018)   . oral contraceptive (PATIENT'S OWN SUPPLY) Take 1 Tab by mouth Once a day   . valACYclovir (VALTREX) 500 mg Oral Tablet Take 1 Tab (500 mg total) by mouth Twice daily     Social History:    Social History     Socioeconomic History   . Marital status: Single     Spouse name: Not on file   . Number of children: Not on file   .  Years of education: Not on file   . Highest education level: Not on file   Occupational History   . Not on file   Social Needs   . Financial resource strain: Not on file   . Food insecurity:     Worry: Not on file     Inability: Not on file   . Transportation needs:     Medical: Not on file     Non-medical: Not on file   Tobacco Use   . Smoking status: Never Smoker   . Smokeless tobacco: Never Used   Substance and Sexual Activity   . Alcohol use: Never     Frequency: Never   . Drug use: Never   . Sexual activity: Not on file   Lifestyle   . Physical activity:     Days per week: Not on file     Minutes per session: Not on file   . Stress: Not on file   Relationships   . Social connections:     Talks on phone: Not on file     Gets together: Not on file     Attends religious service: Not on file     Active member of club or organization: Not on file     Attends meetings of clubs or organizations: Not on file     Relationship status: Not on file   . Intimate partner violence:     Fear of current or ex partner: Not on file     Emotionally abused: Not on file      Physically abused: Not on file     Forced sexual activity: Not on file   Other Topics Concern   . Not on file   Social History Narrative   . Not on file     Family History:  No significant family history.  Family History   Problem Relation Age of Onset   . No Known Problems Mother    . No Known Problems Father    . No Known Problems Maternal Grandmother    . No Known Problems Maternal Grandfather    . No Known Problems Paternal Grandmother    . No Known Problems Paternal Grandfather      Review of Systems:  Review of Systems   Constitutional: Negative for chills and fever.   HENT: Negative for congestion, ear pain, rhinorrhea and sore throat.    Eyes: Negative for pain and visual disturbance.   Respiratory: Negative for cough and shortness of breath.    Cardiovascular: Negative for chest pain.   Gastrointestinal: Negative for abdominal pain, constipation, diarrhea, nausea and vomiting.   Genitourinary: Negative.    Musculoskeletal: Negative for arthralgias and myalgias.   Skin: Negative for rash and wound.   Neurological: Negative for dizziness, light-headedness and headaches.   Hematological: Negative for adenopathy.   Psychiatric/Behavioral: Positive for decreased concentration.     Physical Exam:  Vital signs:   Vitals:    06/24/18 0944   BP: 110/77   Pulse: 70   Resp: 16   Temp: 36.7 C (98 F)   TempSrc: Thermal Scan   SpO2: 98%   Weight: 51.9 kg (114 lb 6.4 oz)   Height: 1.575 m (5' 2")   BMI: 20.97         Body mass index is 20.92 kg/m. Facility age limit for growth percentiles is 20 years.  No LMP recorded. (Menstrual status: Continuous Cycle OCP).    Physical Exam   Constitutional: She  is oriented to person, place, and time. She appears well-developed and well-nourished. No distress.   HENT:   Head: Normocephalic and atraumatic.   Right Ear: External ear normal.   Left Ear: External ear normal.   Eyes: Conjunctivae are normal. Right eye exhibits no discharge. Left eye exhibits no discharge.   Neck:  Normal range of motion.   Cardiovascular: Normal rate.   Pulmonary/Chest: Effort normal. No stridor. No respiratory distress.   Musculoskeletal: Normal range of motion. She exhibits no edema or tenderness.   Neurological: She is alert and oriented to person, place, and time.   Skin: Skin is warm and dry. She is not diaphoretic.   Psychiatric: She has a normal mood and affect. Her behavior is normal.   Nursing note and vitals reviewed.       Assessment:   1. Difficulty concentrating      Plan:  No orders of the defined types were placed in this encounter.       Patient referred to the [facility] for [place] [place].   Patient expresses that costs could be a concern, at which point we will consider referring her to Psychiatry for further evaluation under their care.    Advised patient/patient's guardian(s) to go to the Emergency Department immediately for further work up, if any concerning symptoms.  If symptoms are worsening or not improving, the patient should return to [facility] for further evaluation.  Plan was discussed and patient/patient's guardian(s) verbalized understanding.      Nestor Lewandowsky Iona Beard, MD, FAAFP   06/24/2018, 11:13  Attending Physician, Urgent Care & [facility]  Assistant Professor, Emergency Medicine   Department of Emergency Medicine   [facility]

## 2018-07-22 ENCOUNTER — Encounter (HOSPITAL_BASED_OUTPATIENT_CLINIC_OR_DEPARTMENT_OTHER): Payer: Self-pay | Admitting: Obstetrics & Gynecology

## 2018-07-22 ENCOUNTER — Ambulatory Visit: Payer: BC Managed Care – PPO | Attending: Obstetrics & Gynecology | Admitting: Obstetrics & Gynecology

## 2018-07-22 ENCOUNTER — Ambulatory Visit (HOSPITAL_BASED_OUTPATIENT_CLINIC_OR_DEPARTMENT_OTHER): Payer: Self-pay | Admitting: Obstetrics & Gynecology

## 2018-07-22 VITALS — BP 124/69 | HR 84 | Temp 97.7°F | Ht 62.84 in | Wt 113.3 lb

## 2018-07-22 DIAGNOSIS — Z3041 Encounter for surveillance of contraceptive pills: Secondary | ICD-10-CM | POA: Insufficient documentation

## 2018-07-22 DIAGNOSIS — A609 Anogenital herpesviral infection, unspecified: Secondary | ICD-10-CM | POA: Insufficient documentation

## 2018-07-22 DIAGNOSIS — Z309 Encounter for contraceptive management, unspecified: Secondary | ICD-10-CM

## 2018-07-22 DIAGNOSIS — B009 Herpesviral infection, unspecified: Secondary | ICD-10-CM

## 2018-07-22 DIAGNOSIS — Z6820 Body mass index (BMI) 20.0-20.9, adult: Secondary | ICD-10-CM

## 2018-07-22 MED ORDER — L NORGEST/E ESTRADIOL-E ESTRAD 0.15 MG-30 MCG (84)/10 MCG(7) TABS,3MOS
1.0000 | ORAL_TABLET | Freq: Every day | ORAL | 3 refills | Status: AC
Start: 2018-07-22 — End: ?

## 2018-07-22 MED ORDER — VALACYCLOVIR 500 MG TABLET
500.00 mg | ORAL_TABLET | Freq: Two times a day (BID) | ORAL | 12 refills | Status: AC
Start: 2018-07-22 — End: ?

## 2018-07-22 NOTE — Telephone Encounter (Signed)
Regarding: TUBENS//PRIOR AUTH  ----- Message from Shawnie Pons sent at 07/22/2018 12:53 PM EDT -----  TUBENS    Prescribed the following medication and pharmacy told her that she will need prior auth since she will be on them long term.    valACYclovir (VALTREX) 500 mg Oral Tablet    Preferred Pharmacy     [id number] - [place], [place] [address]    Phone: [phone] Fax: [phone]    Not a 24 hour pharmacy; exact hours not known.

## 2018-07-22 NOTE — Progress Notes (Signed)
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 She reports that she needs a refill on her OCP's. She [facility] earlier this year which was normal. She takes valtrex for episodes of HSV but would rather prevent the outbreaks in the first place. She is now using barrier protection to prevent further STI. She is sexually active with one partner.     Review of Systems   General/constitutional: good general state of health/well-being.  Cardiovascular: Negative.   Respiratory: Negative   Gastrointestinal: Negative  Genitourinary: Negative for dysuria, urgency, frequency, hematuria and flank pain.   Musculoskeletal: Negative.   Endo/Heme/Allergies: Negative.   Neurological: :Negative  Psychiatric: Negative.  Skin: Negative    A: 22yo for OCP refill and HSV discussion    P: We refilled her OCP's and recommended prophylaxis for her HSV. She is amenable to my recs. I sent her in for 500mg  bid to be taken daily. Encouraged safe sex practices. Will f/u prn or for annual.     I spent greater than 50% of this 45 minute visit counseling the patient on treatment options detailed in the assessment and plan above.     Claretha Cooper, MD  07/22/2018, 09:14

## 2018-07-22 NOTE — Telephone Encounter (Signed)
Spoke with [facility] and they have approved valacyclovir until 07/23/2019.  Pt and pharmacy notified. Manfred Arch, RN  07/22/2018, 14:18

## 2018-08-30 ENCOUNTER — Encounter (INDEPENDENT_AMBULATORY_CARE_PROVIDER_SITE_OTHER): Payer: Self-pay

## 2018-08-30 ENCOUNTER — Ambulatory Visit (INDEPENDENT_AMBULATORY_CARE_PROVIDER_SITE_OTHER): Payer: BC Managed Care – PPO

## 2018-08-30 VITALS — BP 123/84 | HR 87 | Temp 97.8°F | Resp 18 | Ht 62.0 in | Wt 114.0 lb

## 2018-08-30 DIAGNOSIS — H109 Unspecified conjunctivitis: Secondary | ICD-10-CM

## 2018-08-30 DIAGNOSIS — Z6820 Body mass index (BMI) 20.0-20.9, adult: Secondary | ICD-10-CM

## 2018-08-30 DIAGNOSIS — H1033 Unspecified acute conjunctivitis, bilateral: Secondary | ICD-10-CM

## 2018-08-30 DIAGNOSIS — H5789 Other specified disorders of eye and adnexa: Secondary | ICD-10-CM

## 2018-08-30 MED ORDER — POLYMYXIN B SULFATE 10,000 UNIT-TRIMETHOPRIM 1 MG/ML EYE DROPS
1.00 [drp] | Freq: Four times a day (QID) | OPHTHALMIC | 0 refills | Status: AC
Start: 2018-08-30 — End: 2018-09-06

## 2018-08-30 NOTE — Progress Notes (Addendum)
Attending: Dr. Ladonna Snide   History of Present Illness: Barbara Graves is a 22 y.o. female who presents to the [facility] today with chief complaint of    Chief Complaint            Pink Eye both eyes       .   [facility] with c/o bilateral eye irritation onset 3-4 days ago. Of note, pt wears glasses but she does not wear contact lenses. Over the past 3-4 days, she has had to "pry her eyes apart" when she wakes up in the morning because of dried crusting drainage on her eyelashes. Last night she had to sleep with a warm washcloth on her eyes because they were draining and she felt that her eyes were so dry "that she could hear herself blink." The eyes continue to be irritated and erythematous throughout the day and she feels some pain intermittently as well. For her sxs, she has not used any OTC eyedrops. At this time, pt associates bilateral eye erythema, bilateral eye drainage, bilateral eyelash crusting, photophobia, bilateral eye pruritis and headache. She denies fever, sore throat, sinus congestion, sinus drainage, blurred vision, eye pain and dizziness.        Functional Health Screening:    Patient is under 18:  No  Have you had a recent unexplained weight loss or gain?:  No  Because we are aware of abuse and domestic violence today, we ask all patients: Are you being hurt, hit, or frightened by anyone at your home or in your life?:  No  Do you have any basic needs within your home that are not being met? (such as Food, Shelter, Games developer, Transportation):  No  Patient is under 18 and therefore has no Advance Directives:  No  Patient has:  No Advance  Patient has Advance Directive:  No  Patient offered:  Refused Packet  Screening unable to be completed:  No         I reviewed and confirmed the patient's past medical history taken by the nurse or medical assistant with the addition of the following:    Past Medical History:    Past Medical History:   Diagnosis Date   . STD (sexually  transmitted disease)     HVS2     Past Surgical History:    Past Surgical History:   Procedure Laterality Date   . HX NO SURGICAL PROCEDURES       Allergies:  No Known Allergies     Medications:    Current Outpatient Medications   Medication Sig   . L norgesterol-E estradiol/E estradiol (ASHLYNA) 0.15 mg-30 mcg/10 mcg Oral tablet Take 1 Tab by mouth Once a day   . polymyxin B sulf-trimethoprim (POLYTRIM) 10,000 unit- 1 mg/mL Ophthalmic Drops Instill 1 Drop into both eyes Every 6 hours for 7 days   . valACYclovir (VALTREX) 500 mg Oral Tablet Take 1 Tab (500 mg total) by mouth Twice daily   . valACYclovir (VALTREX) 500 mg Oral Tablet Take 1 Tab (500 mg total) by mouth Twice daily     Social History:    Social History     Tobacco Use   . Smoking status: Never Smoker   . Smokeless tobacco: Never Used   Substance Use Topics   . Alcohol use: Never     Frequency: Never   . Drug use: Never     Family History: No significant family history.  Family Medical History:  Problem Relation (Age of Onset)    No Known Problems Mother, Father, Maternal Grandmother, Maternal Grandfather, Paternal Grandmother, Paternal Grandfather        Review of Systems:     General: no fever  Eyes: bilateral eye irritation, bilateral eye erythema, bilateral eye drainage, bilateral eyelash crusting, photophobia, bilateral eye pruritis, no blurred vision, no eye pain   ENT: no sore throat, no sinus congestion, no sinus drainage   Neurologic: headache, no dizziness   All other review of systems were negative.     Physical Exam:  Vital signs:   Vitals:    08/30/18 1757   BP: 123/84   Pulse: 87   Resp: 18   Temp: 36.6 C (97.8 F)   TempSrc: Thermal Scan   SpO2: 97%   Weight: 51.7 kg (114 lb)   Height: 1.575 m ('5\' 2"'$ )   BMI: 20.89     Body mass index is 20.85 kg/m. Facility age limit for growth percentiles is 20 years.  Patient's last menstrual period was 06/30/2018.    General:  Well appearing and no acute distress  Head:  Normocephalic  Eyes:   PERRLA, EOMI, normal lids/lashes and normal conjunctiva  ENT:  Normal EAC's, normal TM's, MMM, normal pharynx/tonsils and normal tongue/uvula  Neck:  Supple   Pulmonary:  Clear to auscultation bilaterally, no wheezes, no rales and no rhonchi  Cardiovascular:  Regular rate/rhythm, normal S1/S2 and no murmur/rub/gallop  Gastrointestinal:  Non-distended, normal bowel sounds, soft, non-tender, and no guarding  Skin:  Warm/dry and no rash  Psychiatric:  Appropriate affect and behavior  Neurologic:   Alert and oriented  Hem/Lymph:  No cervical lymphadenopathy     Data Reviewed:  Point-of-care testing:           Right Eye Reading: 20/30  Initials: tj  Left Eye Reading: 20/40                Course: Condition at discharge: Good     Differential Diagnosis: bacterial conjunctivitis vs allergic conjunctivitis vs viral conjunctivitis     Assessment:   1. Conjunctivitis    2. Eye drainage      Plan:    Orders Placed This Encounter   . POCT HEARING/VISION/TYMPANOGRAM (AMB ONLY)   . polymyxin B sulf-trimethoprim (POLYTRIM) 10,000 unit- 1 mg/mL Ophthalmic Drops         Educated on the use of Polytrim drops.   Can use warm compresses for relief as needed.  Avoid wearing eye makeup. Throw any eye makeup and makeup brushes that could have been used prior to/during infection.   Practice proper hand hygiene to avoid spreading conjunctivitis.   Follow up in clinic as needed.     Go to Emergency Department immediately for further work up if any concerning symptoms.  Plan was discussed and patient verbalized understanding. If symptoms are worsening or not improving the patient should return to the [facility] for further evaluation.    I am scribing for, and in the presence of, Arlis Porta, APRN for services provided on 08/30/2018.  Danley Danker, SCRIBE     Danley Danker, SCRIBE 08/30/2018, 18:38   I personally performed the services described in this documentation, as scribed  in my presence, and it is both accurate  and complete.    Arlis Porta, APRN,FNP-BC  Supervising physician was physically present in Urgent Care and available for consultation and did not participate in the care of this patient.   Arlis Porta, APRN,FNP-BC  08/30/2018, 19:49

## 2018-08-30 NOTE — Nursing Note (Signed)
Patient has given verbal permission for the scribe to assist the healthcare provider during the patients visit.Dannielle Burn, RN 08/30/2018, 17:58

## 2018-09-03 ENCOUNTER — Encounter (FREE_STANDING_LABORATORY_FACILITY): Payer: BC Managed Care – PPO | Admitting: NURSE PRACTITIONER-WOMENS HEALTH

## 2018-09-03 ENCOUNTER — Encounter (FREE_STANDING_LABORATORY_FACILITY)
Admit: 2018-09-03 | Discharge: 2018-09-03 | Disposition: A | Discharge status: Home / Self Care | Payer: BC Managed Care – PPO | Attending: NURSE PRACTITIONER-WOMENS HEALTH | Admitting: NURSE PRACTITIONER-WOMENS HEALTH

## 2018-09-03 ENCOUNTER — Encounter (INDEPENDENT_AMBULATORY_CARE_PROVIDER_SITE_OTHER): Payer: Self-pay

## 2018-09-03 ENCOUNTER — Ambulatory Visit (INDEPENDENT_AMBULATORY_CARE_PROVIDER_SITE_OTHER): Payer: BC Managed Care – PPO | Admitting: NURSE PRACTITIONER-WOMENS HEALTH

## 2018-09-03 VITALS — BP 130/87 | HR 60 | Temp 98.0°F | Resp 18 | Ht 62.0 in | Wt 113.0 lb

## 2018-09-03 DIAGNOSIS — N760 Acute vaginitis: Secondary | ICD-10-CM

## 2018-09-03 DIAGNOSIS — N898 Other specified noninflammatory disorders of vagina: Principal | ICD-10-CM

## 2018-09-03 DIAGNOSIS — Z6820 Body mass index (BMI) 20.0-20.9, adult: Secondary | ICD-10-CM

## 2018-09-03 DIAGNOSIS — B9689 Other specified bacterial agents as the cause of diseases classified elsewhere: Secondary | ICD-10-CM

## 2018-09-03 MED ORDER — METRONIDAZOLE 500 MG TABLET
500.00 mg | ORAL_TABLET | Freq: Two times a day (BID) | ORAL | 0 refills | Status: DC
Start: 2018-09-03 — End: 2019-01-20

## 2018-09-03 NOTE — Progress Notes (Signed)
[place]  [place] [address]    PATIENT NAME:  Barbara Graves  MRN:  [id number]  DOB:  1996-05-06  DATE OF SERVICE: 09/03/2018    Chief Complaint   Patient presents with   . STI Screening     vaginal discharge and odor        History of Present Illness: Barbara Graves is a 22 y.o. female who presents to [facility] today for the above complaint.  HPI    Patient admits to   new onset of vaginal discharge and odor x 3 days   Patient denies  exposure to STI  Known exposure to no known diseases.   Patient has history of  chlamydia greater than 2 months ago that was  azithromycin and herpes greater than 2 months ago that was  valtrex.   Last sexual contact 2Months ago with 1 partners.  Was this contact consensual? yes   Contact protected: yes   Condom use: 100%-75%of the time.   Sexual partners in last 60 days: 1  Lifetime sexual partners: 8  Gender of current/past partners Female.  Contraception method: oral contraceptives  Participates in oral vaginal anal sex.   Hx of IVDU or intranasal DU in self or past/present partners: no  Has paid or has been paid for sex in the past: no  Have you completed the HPV vaccine series: yes           Past Medical History:    Past Medical History:   Diagnosis Date   . STD (sexually transmitted disease)     HVS2         Past Surgical History:    Past Surgical History:   Procedure Laterality Date   . HX NO SURGICAL PROCEDURES           Allergies:  No Known Allergies  Medications:  Outpatient Medications Marked as Taking for the 09/03/18 encounter (Office Visit) with Dema Severin, APRN,WHNP-BC   Medication Sig   . L norgesterol-E estradiol/E estradiol (ASHLYNA) 0.15 mg-30 mcg/10 mcg Oral tablet Take 1 Tab by mouth Once a day   . metroNIDAZOLE (FLAGYL) 500 mg Oral Tablet Take 1 Tab (500 mg total) by mouth Twice daily   . polymyxin B sulf-trimethoprim (POLYTRIM) 10,000 unit- 1 mg/mL  Ophthalmic Drops Instill 1 Drop into both eyes Every 6 hours for 7 days     Social History:    Social History     Tobacco Use   . Smoking status: Never Smoker   . Smokeless tobacco: Never Used   Substance Use Topics   . Alcohol use: Never     Frequency: Never      Family History:  Family Medical History:     Problem Relation (Age of Onset)    No Known Problems Mother, Father, Maternal Grandmother, Maternal Grandfather, Paternal 77, Paternal Grandfather            Review of Systems:  Review of Systems   Constitutional: Negative for chills and fever.   Gastrointestinal: Negative for nausea and vomiting.   Genitourinary: Positive for vaginal discharge and vaginal pain. Negative for dysuria, flank pain, frequency, hematuria, menstrual problem and vaginal bleeding.     Physical Exam:  Vitals:    09/03/18 1342   BP: 130/87   Pulse: 60   Resp: 18   Temp: 36.7 C (98 F)   TempSrc: Thermal Scan  SpO2: 99%   Weight: 51.3 kg (113 lb)   Height: 1.575 m (5\' 2" )   BMI: 20.71         Body mass index is 20.67 kg/m.  Physical Exam  Exam conducted with a chaperone present.   Constitutional:       Appearance: She is well-developed.   HENT:      Head: Normocephalic and atraumatic.   Genitourinary:     Exam position: Lithotomy position.      Labia:         Right: No rash, tenderness or lesion.         Left: No rash, tenderness or lesion.       Vagina: Vaginal discharge, erythema and tenderness present.      Cervix: No discharge, friability or erythema.      Uterus: Not enlarged and not tender.       Adnexa:         Right: No mass, tenderness or fullness.          Left: No mass, tenderness or fullness.     Musculoskeletal: Normal range of motion.   Skin:     General: Skin is warm and dry.   Neurological:      Mental Status: She is alert and oriented to person, place, and time.   Psychiatric:         Behavior: Behavior normal.         Thought Content: Thought content normal.         Judgment: Judgment normal.       Ortho  Exam    Data Reviewed:    POCT Results:       Provider Performed POCT 03/31/2018 09/03/2018   [facility] [address] [place], [place] [address] [facility] [address] [place], [place] [address]   Time Fri Sep 03, 2018  2:12 PM Fri Sep 03, 2018  2:12 PM   Patient Gender - Female   Time -  2:10 PM   Trichomonas Test  None Seen None Seen   Trichomonas Reference Range (Required) None Seen None Seen   Yeast Test  Seen None Seen   Yeast Ref Range(Required) None Seen None Seen   Clue Cells Test None Seen Seen   Clue Cells Ref Range (Required) None Seen None Seen   Specimen Source Vaginal Fluid Vaginal Fluid   Additional Info positive WBC's -              I have reviewed and confirmed the above point of care results.  Dema Severin, APRN,WHNP-BC 09/03/2018, 14:12  UML labs ordered    Assessment:   1. Vaginal discharge    2. Vaginal odor    3. BV (bacterial vaginosis)      Plan:    Orders Placed This Encounter   . NEISSERIA GONORRHOEAE DNA BY PCR   . CHLAMYDIA TRACHOMITIS DNA BY PCR (INHOUSE)   . POCT VAGINAL KOH & SALINE ([place] ONLY)   . metroNIDAZOLE (FLAGYL) 500 mg Oral Tablet     STD testing will be back in a few days and we will call with results all positive STDs are reported to the [facility] per [facility] law. The best way to protect yourself from STDs is reducing number of partners and 100% condom use     BV Flagyl 500 mg PO BID x 7 days no ETOH 3 days before the medication while pt is taking it and 3 days  after medication. Pt advised to only shower with dove soap and washcloth no scented products use only unscented pads and tampons no gain detergent       Return if symptoms worsen or fail to improve.  Dema Severin, APRN,WHNP-BC  The co-signing faculty was physically present in [facility] and available for consultation and did not particpate in the care of this patient.

## 2018-09-03 NOTE — Patient Instructions (Addendum)
Bacterial Vaginosis    You have a vaginal infection called bacterial vaginosis (BV). Both good and bad bacteria are present in a healthy vagina. BV occurs when these bacteria get out of balance. The number of bad bacteria increase. And the number of good bacteria decrease. Although BV is associated with sexual activity, it is not a sexually transmitted disease.  BV may or may not cause symptoms. If symptoms do occur, they can include:   Thin, gray, milky-white, or sometimes green discharge   Unpleasant odor or "fishy" smell   Itching, burning, or pain in or around the vagina  It is not known what causes BV, but certain factors can make the problem more likely. This can include:   Douching   Having sex with a new partner   Having sex with more than one partner  BV will sometimes go away on its own. But treatment is usually recommended. This is because untreated BV can increase the risk of more serious health problems such as:   Pelvic inflammatory disease (PID)   Preterm delivery (giving birth to a baby early if you're pregnant)   HIV and certain other sexually transmitted diseases (STDs)   Infection after surgery on the reproductive organs  Home care  General care   BV is most often treated with medicines called antibiotics. These may be given as pills or as a vaginal cream.If antibiotics are prescribed, be sure to use them exactly as directed. Also, be sure to complete all of the medicine, even if your symptoms go away.   Don't douche or having sex during treatment.   If you have sex with a female partner, ask your healthcare provider if she should also be treated.  Prevention   Don't douche.   Don't have sex. If you do have sex, then take steps to lower your risk:  ? Use condoms when having sex.  ? Limit the number of sexual partners you have.  Follow-up care  Follow up with your healthcare provider, or as advised.  When to seek medical advice  Call your healthcare provider right away if:   You  have a fever of100.72F (38C)or higher, or as directed by your provider.   Your symptoms worsen, or they don't go away within a few days of starting treatment.   You have new pain in the lower bellyor pelvic region.   You have side effects that bother you or a reaction to the pills or cream you're prescribed.   You or any partners you have sex with have new symptoms, such as a rash, joint pain, or sores.  StayWell last reviewed this educational content on 06/22/2016   2000-2019 [employer]. [address] [place] [address]. All rights reserved. This information is not intended as a substitute for professional medical care. Always follow your healthcare professional's instructions.        [place]     Operated by [facility]  [place], [place] [address]  Phone: [phone]  Fax: [phone]  [email or web address]  Twitter [email or web address]   Closed all  Holidays        Attending Caregiver: Dema Severin, APRN,WHNP-BC    Today's orders:   Orders Placed This Encounter   . NEISSERIA GONORRHOEAE DNA BY PCR   . CHLAMYDIA TRACHOMITIS DNA BY PCR (INHOUSE)   . POCT VAGINAL KOH & SALINE ([place] [place] ONLY)   . metroNIDAZOLE (FLAGYL) 500 mg Oral Tablet  Prescription(s) E-Rx to:  [id number] [place], [place]    Future appts with Student [facility]  Visit date not found    [place]. THIS IS ONE EASY WAY TO COMMUNICATE WITH [facility].  SEE THE CODE ON THIS FORM FOR ACCESS.    -----------------------------PRIVACY INFORMATION-----------------------------------------------  As a [place] student, regardless of your age, we cannot discuss your personal health information with a parent, spouse, family member or anyone else without your expressed consent.    This policy does not include:  Individuals who would have a legitimate reason to access your records and information to assist  in your care under the provisions of HIPAA ([place] and [place]) law;   Individuals with whom you have previously given expressed written consent to do so, such a legal guardian or Power of Attorney.    No one can access your My[facility], unless you give them your account sign on and password. You will receive emails from [place].  This means anyone who has access to the email account you provided can see this notification. There will be no private medical information included in these emails. This notification of  new medical information  available in your [facility], may be information that you do not want others to know.   _______________________________________________________________________    If your symptoms persist, worsen or you develop any new or concerning symptoms please call [facility] for at [phone] for a follow up appointment.   If your symptoms are severe, go immediately to the emergency department or call 911.  Please check with your health insurance coverage for these types of visits.   Please keep all follow up visit recommended at today's visit.    If you received x-rays during your visit, be aware that the final and formal interpretation of those films by a radiologist will occur after your discharge.  If there is a significant discrepancy identified after your discharge, we will contact you at the telephone number provided during registration.  These results are available for your review on [facility].    Please refer to your [facility] for lab test results. Lab test results related to HIV, STI's and pregnancy are considered private and are therefore not immediately visible in your [facility], we may still be able to send a generic MyChart message for these results.  If you have cultures pending for sexually transmitted diseases, you will be contacted by phone.     Positive cultures are reported to the [place] Department of Health, as required by state  law. These results are considered private on My[facility] and can only be released by the provider.We will not contact you if the results are negative.    It is very important that we have a phone number that is the single best way to contact you in the event that we become aware of important clinical information or concerns after your discharge.  If the phone number you provided at registration is NOT this number you should inform staff and registration prior to leaving.     Please call [place] at [phone] with any further questions.    Instructions discussed with patient upon discharge by clinical staff with all questions answered.    Dema Severin, APRN,WHNP-BC 09/03/2018, 14:10

## 2018-09-06 ENCOUNTER — Other Ambulatory Visit (INDEPENDENT_AMBULATORY_CARE_PROVIDER_SITE_OTHER): Payer: Self-pay | Admitting: Student in an Organized Health Care Education/Training Program

## 2018-09-06 LAB — NEISSERIA GONORRHOEAE DNA BY PCR: NEISSERIA GONORRHOEAE PCR: NOT DETECTED

## 2018-09-06 MED ORDER — AZITHROMYCIN 500 MG TABLET
1000.0000 mg | ORAL_TABLET | Freq: Once | ORAL | 0 refills | Status: AC
Start: 2018-09-06 — End: 2018-09-06

## 2018-09-09 ENCOUNTER — Encounter (INDEPENDENT_AMBULATORY_CARE_PROVIDER_SITE_OTHER): Payer: Self-pay | Admitting: Women's Health

## 2018-09-23 ENCOUNTER — Ambulatory Visit (INDEPENDENT_AMBULATORY_CARE_PROVIDER_SITE_OTHER): Payer: BC Managed Care – PPO | Admitting: NURSE PRACTITIONER-WOMENS HEALTH

## 2018-09-23 ENCOUNTER — Encounter (FREE_STANDING_LABORATORY_FACILITY): Payer: BC Managed Care – PPO | Admitting: Emergency Medicine

## 2018-09-23 ENCOUNTER — Encounter (INDEPENDENT_AMBULATORY_CARE_PROVIDER_SITE_OTHER): Payer: Self-pay | Admitting: NURSE PRACTITIONER-WOMENS HEALTH

## 2018-09-23 ENCOUNTER — Encounter (FREE_STANDING_LABORATORY_FACILITY)
Admit: 2018-09-23 | Discharge: 2018-09-23 | Disposition: A | Discharge status: Home / Self Care | Payer: BC Managed Care – PPO | Attending: Emergency Medicine | Admitting: Emergency Medicine

## 2018-09-23 VITALS — BP 127/86 | HR 72 | Temp 98.0°F | Resp 18 | Ht 62.0 in | Wt 110.0 lb

## 2018-09-23 DIAGNOSIS — R55 Syncope and collapse: Secondary | ICD-10-CM | POA: Insufficient documentation

## 2018-09-23 DIAGNOSIS — R42 Dizziness and giddiness: Secondary | ICD-10-CM | POA: Insufficient documentation

## 2018-09-23 DIAGNOSIS — Z3202 Encounter for pregnancy test, result negative: Secondary | ICD-10-CM

## 2018-09-23 DIAGNOSIS — N926 Irregular menstruation, unspecified: Secondary | ICD-10-CM | POA: Insufficient documentation

## 2018-09-23 DIAGNOSIS — R519 Headache, unspecified: Secondary | ICD-10-CM

## 2018-09-23 DIAGNOSIS — Z6820 Body mass index (BMI) 20.0-20.9, adult: Secondary | ICD-10-CM

## 2018-09-23 DIAGNOSIS — R51 Headache: Secondary | ICD-10-CM

## 2018-09-23 LAB — COMPREHENSIVE METABOLIC PANEL, NON-FASTING
ALBUMIN: 3.9 g/dL (ref 3.5–5.0)
ALKALINE PHOSPHATASE: 55 U/L (ref 40–110)
ALT (SGPT): 16 U/L (ref <55)
ANION GAP: 7 mmol/L (ref 4–13)
AST (SGOT): 19 U/L (ref 8–41)
BILIRUBIN TOTAL: 0.6 mg/dL (ref 0.3–1.3)
BUN/CREA RATIO: 12 (ref 6–22)
BUN: 11 mg/dL (ref 8–25)
CALCIUM: 9.5 mg/dL (ref 8.5–10.2)
CHLORIDE: 108 mmol/L (ref 96–111)
CO2 TOTAL: 24 mmol/L (ref 22–32)
CREATININE: 0.9 mg/dL (ref 0.49–1.10)
ESTIMATED GFR: >60 mL/min/1.73mˆ2 (ref >60)
GLUCOSE: 81 mg/dL (ref 65–139)
POTASSIUM: 4.3 mmol/L (ref 3.5–5.1)
PROTEIN TOTAL: 7.4 g/dL (ref 6.4–8.3)
SODIUM: 139 mmol/L (ref 136–145)

## 2018-09-23 LAB — CBC WITH DIFF
BASOPHIL #: <0.1 10*3/uL (ref <=0.20)
BASOPHIL %: 1 %
EOSINOPHIL #: <0.1 x10ˆ3/uL (ref <=0.50)
EOSINOPHIL %: 1 %
HCT: 40.9 % (ref 34.8–46.0)
HGB: 13.7 g/dL (ref 11.5–16.0)
IMMATURE GRANULOCYTE #: <0.1 x10ˆ3/uL (ref <0.10)
IMMATURE GRANULOCYTE %: 0 % (ref 0–1)
LYMPHOCYTE #: 2.88 x10ˆ3/uL (ref 1.00–4.80)
LYMPHOCYTE %: 40 %
MCH: 32.5 pg — ABNORMAL HIGH (ref 26.0–32.0)
MCHC: 33.5 g/dL (ref 31.0–35.5)
MCV: 96.9 fL (ref 78.0–100.0)
MONOCYTE #: 0.48 x10ˆ3/uL (ref 0.20–1.10)
MONOCYTE %: 7 %
MPV: 9.6 fL (ref 8.7–12.5)
NEUTROPHIL #: 3.65 10*3/uL (ref 1.50–7.70)
NEUTROPHIL %: 51 %
PLATELETS: 293 10*3/uL (ref 150–400)
RBC: 4.22 x10ˆ6/uL (ref 3.85–5.22)
RDW-CV: 12.9 % (ref 11.5–15.5)
WBC: 7.2 10*3/uL (ref 3.7–11.0)

## 2018-09-23 LAB — THYROID STIMULATING HORMONE WITH FREE T4 REFLEX: TSH: 1.752 u[IU]/mL (ref 0.350–5.000)

## 2018-09-23 NOTE — Nursing Note (Signed)
Labs completed lt ac,( 1 green, 1 gold, 1 lav ) pt tolerated well.  Band-aid placed and labs sent.  Jettie Booze, RTR  09/23/2018, 15:58

## 2018-09-23 NOTE — Patient Instructions (Addendum)
Video [employer]  Abnormal Uterine Bleeding  Abnormal uterine bleeding is irregular bleeding during a menstrual cycle that is caused by hormonal problems. The most common hormonal irregularity occurs because the ovary did not produce an egg during that particular cycle. This video explores other causes and when this bleeding is of concern.    To watch the video:  Scan the QR code  Using your mobile device, scan the following code:    OR  Go to the website:  www.[email or web address]  Enter the prescription code:  Judie Bonus      2000-2018 [employer] [place]. [address], [place] [address]. All rights reserved. This information is not intended as a substitute for professional medical care. Always follow your healthcare professional's instructions.        [place]     Operated by [facility]  [place],  [address]  Phone: [phone]  Fax: [phone]  [email or web address]  Twitter [email or web address]   Closed all  Holidays        Attending Caregiver: Dema Severin, APRN,WHNP-BC    Today's orders:   Orders Placed This Encounter    POCT VAGINAL KOH & SALINE (WET [place] ONLY)    POCT URINE PREGNANCY         Prescription(s) E-Rx to:  [id number] [place], [place]    Future appts with Student Health  Visit date not found    PLEASE ACTIVATE YOUR [place]. THIS IS ONE EASY WAY TO COMMUNICATE WITH [facility].  SEE THE CODE ON THIS FORM FOR ACCESS.    -----------------------------PRIVACY INFORMATION-----------------------------------------------  As a [place] student, regardless of your age, we cannot discuss your personal health information with a parent, spouse, family member or anyone else without your expressed consent.    This policy does not include:  Individuals who would have a legitimate reason to access your records and information to assist in your care under the provisions of HIPAA ([place] and [place]) law;   Individuals with whom you have previously given expressed written consent to do so, such a legal guardian or Power of Attorney.    No one can access your My[facility], unless you give them your account sign on and password. You will receive emails from [place].  This means anyone who has access to the email account you provided can see this notification. There will be no private medical information included in these emails. This notification of  new medical information  available in your [facility], may be information that you do not want others to know.   _______________________________________________________________________    If your symptoms persist, worsen or you develop any new or concerning symptoms please call [facility] for at [phone] for a follow up appointment.   If your symptoms are severe, go immediately to the emergency department or call 911.  Please check with your health insurance coverage for these types of visits.   Please keep all follow up visit recommended at today's visit.    If you received x-rays during your visit, be aware that the final and formal interpretation of those films by a radiologist will occur after your discharge.  If there is a significant discrepancy identified after your discharge, we will contact you at the telephone number provided during registration.  These results are available for your review on [facility].    Please refer to your [facility] for lab test results. Lab test results related  to HIV, STI's and pregnancy are considered private and are therefore not immediately visible in your [facility], we may still be able to send a generic MyChart message for these results.  If you have cultures pending for sexually transmitted diseases, you will be contacted by phone.     Positive cultures are reported to the [place] Department of Health, as required by state law. These results are considered private on  My[facility] and can only be released by the provider.We will not contact you if the results are negative.    It is very important that we have a phone number that is the single best way to contact you in the event that we become aware of important clinical information or concerns after your discharge.  If the phone number you provided at registration is NOT this number you should inform staff and registration prior to leaving.     Please call [place] at [phone] with any further questions.    Instructions discussed with patient upon discharge by clinical staff with all questions answered.    Dema Severin, APRN,WHNP-BC 09/23/2018, 15:05

## 2018-09-23 NOTE — Progress Notes (Signed)
History of Present Illness: Barbara Graves is a 23 y.o. female who presents to the [facility] today with chief complaint of    Chief Complaint            Vaginal Discharge x 3 weeks, pt was treated for BV and discharge started after meds     Other Pt states she passed out on New Years Eve x 3 times        Pt presents to [facility] with c/o syncopal episodes onset 1.5 days ago. She reports that she went out to a club 2 nights ago after having only a couple of drinks. Pt sts that she was not intoxicated and she knew where the drinks had come from. She reports that she felt fine while at the club for 1.5 hours and she did not drink anymore once she got there. Pt sts that she went outside and smoked 1 "hit" of marijuana with her friends and felt "weird" after doing so. She reports that she went to the bathroom with her friend and she started hearing a ringing in her ears and getting lightheaded. Pt sts that she ended up having a syncopal episode while in the bathroom shortly after those feelings onset but she did not suffer any trauma because her friend caught her. She believes that she was only out for a few seconds but she is not sure of the actual length of the syncopal episode. Pt sts that her friend told her that she might have had seizure-like shaking right before her syncopal episode but she was not sure. She reports that she was able to stand up afterwards but she had another syncopal episode shortly afterwards but she was sitting down this time because she felt it coming on again. Pt sts that she was able to get up and make it back to her friend's apartment where she started feeling lightheaded again while walking up the stairs. She reports that once she got to her friend's bedroom she started feeling like she was going to pass out again as well as nauseous but she started to vomit and that prevented her from having a 3rd syncopal episode. Pt sts that she has not had any vomiting or syncopal  episodes since that night. She reports that she felt "groggy" all day 1 day ago and she had migraine-like headaches. Pt sts she pushed water and electrolyte solutions 1 day ago to stay hydrated. She reports that she only takes oral contraceptive daily and she did not take any other medications the day of the syncopal episodes. Pt sts that she does not have a familial Hx of similar sxs. She reports that she has a Hx of mono. Pt sts that she usually passes out when getting blood drawn. She associates headache, lightheadedness, cervical lymphadenopathy, fatigue, nausea, and vomiting (resolved).    On average, how many days/week do you engage in moderate to vigorous physical activity (like brisk walking)?: 0 Days    Functional Health Screening:    Patient is under 18:  No  Have you had a recent unexplained weight loss or gain?:  No  Because we are aware of abuse and domestic violence today, we ask all patients: Are you being hurt, hit, or frightened by anyone at your home or in your life?:  No  Do you have any basic needs within your home that are not being met? (such as Food, Shelter, Games developer, Transportation):  No  Patient is under 18 and therefore has no  Advance Directives:  No  Patient has:  No Advance  Patient has Advance Directive:  No  Patient offered:  Refused Packet  Screening unable to be completed:  No       I reviewed and confirmed the patient's past medical history taken by the nurse or medical assistant with the addition of the following:    Past Medical History:    Past Medical History:   Diagnosis Date   . STD (sexually transmitted disease)     HVS2     Past Surgical History:    Past Surgical History:   Procedure Laterality Date   . HX NO SURGICAL PROCEDURES       Allergies:  No Known Allergies     Medications:    Current Outpatient Medications   Medication Sig   . L norgesterol-E estradiol/E estradiol (ASHLYNA) 0.15 mg-30 mcg/10 mcg Oral tablet Take 1 Tab by mouth Once a day   . metroNIDAZOLE (FLAGYL)  500 mg Oral Tablet Take 1 Tab (500 mg total) by mouth Twice daily (Patient not taking: Reported on 09/23/2018)   . valACYclovir (VALTREX) 500 mg Oral Tablet Take 1 Tab (500 mg total) by mouth Twice daily   . valACYclovir (VALTREX) 500 mg Oral Tablet Take 1 Tab (500 mg total) by mouth Twice daily     Social History:    Social History     Tobacco Use   . Smoking status: Never Smoker   . Smokeless tobacco: Never Used   Substance Use Topics   . Alcohol use: Never     Frequency: Never   . Drug use: Never     Family History: No significant family history.  Family Medical History:     Problem Relation (Age of Onset)    No Known Problems Mother, Father, Maternal Grandmother, Maternal Grandfather, Paternal Grandmother, Paternal Grandfather        Review of Systems:    General: Fatigue  Gastrointestinal: Nausea and vomiting  Neurologic: Syncope, headache, and lightheadedness  Heme/Lymph: Cervical lymphadenopathy  All other review of systems were negative    Physical Exam:  Vital signs:   Vitals:    09/23/18 1437 09/23/18 1546 09/23/18 1547 09/23/18 1548   BP: 123/83 114/73 114/76 127/86   Pulse: 84 68 67 72   Resp: 18      Temp: 36.7 C (98 F)      TempSrc: Tympanic      SpO2: 97%      Weight: 49.9 kg (110 lb)      Height: 1.575 m (_0 )      BMI: 20.16        Body mass index is 20.12 kg/m. Facility age limit for growth percentiles is 20 years.  No LMP recorded. (Menstrual status: Continuous Cycle OCP).    General:  Well appearing and no acute distress  ENT:  Normal EAC's, normal TM's, MMM, normal pharynx/tonsils, normal tongue/uvula and no sinus tenderness to palpation  Neck:  Supple, no thyromegaly or nodules  Pulmonary:  Clear to auscultation bilaterally, no wheezes, no rales and no rhonchi  Cardiovascular:  Regular rate/rhythm, normal S1/S2 and no murmur/rub/gallop  Gastrointestinal:  Non-distended, soft, non-tender and no guarding  Musculoskeletal: Left trapezius, left paraspinal muscles, and mild right paraspinal TTP,  no spinal TTP, full range of motion of cervical spine with 5/5 muscle strength  Skin:  Warm/dry and no rash  Psychiatric:  Appropriate affect and behavior  Neurologic:   Alert and oriented x 3, normal finger-to-nose testing,  PERRLA and extraocular muscles intact, negative Spurling's test, normal tandem gait, cranial nerves 2-12 intact, no focal deficits, upper extremity sensation intact and symmetric  Hem/Lymph: Left mild posterior cervical lymphadenopathy    Data Reviewed:    Point-of-care testing:  Time collected: 1550  Urine HCG: Negative  Leukocytes: Negative  Nitrite: Negative  Urobilinogen: 0.54m/dL (Normal)  Protein: Negative  pH: 6.0  Blood (urine): Small (1+)  Urine Specific Gravity: 1.025  Ketones: Negative  Bilirubin: Negative  Glucose: Negative  Urine Pregnancy: Negative                     Labs: Blood work for TSH, CMP, CBC, and EBV    Course: Condition at discharge: Good     Differential Diagnosis: Vasovagal syncope vs Orthostatic hypotension vs POTS versus viral syndrome such as mono    Assessment:   1. Irregular bleeding    2. Light headedness    3. Syncope, unspecified syncope type    4. Headache      Plan:    Orders Placed This Encounter   . THYROID STIMULATING HORMONE WITH FREE T4 REFLEX   . COMPREHENSIVE METABOLIC PANEL, NON-FASTING   . CBC/DIFF   . EBV ANTIBODY PROFILE   . CBC WITH DIFF   . POCT VAGINAL KOH & SALINE (WET [place] ONLY)   . POCT URINE PREGNANCY   . POCT AUTOMATED RACK URINE WITHOUT MICROSCOPY     - Suspect most likely vasovagal syncope in the setting of alcohol and marijuana use.  Urine rack reviewed no signs of UTI or protein in urine.  Pending blood work for TSH, CMP, CBC, and EBV, will notify with results.  Low suspicion for orthostatic hypotension as orthostatic vitals today within normal limits.  Patient shows no evidence of tachycardia that would be consistent with POTS.  Continue to monitor symptoms for now.  Advised patient to follow up in 1-2 weeks if she has another  syncopal episode or if symptoms persist consider further evaluation with imaging at that time ED precautions discussed.  Suspect headache most likely secondary to tension headache due to trapezius tenderness on exam, encouraged patient to try been ibuprofen or Aleve over-the-counter as needed however not to use it more than 10 times in 1 month to avoid rebound headache.  Continue to stay well hydrated.  Refer to APRN's note for patient's evaluation of vaginal discharge and irregular bleeding as the symptoms were not evaluated by myself.    Go to Emergency Department immediately for further work up if any concerning symptoms.  Plan was discussed and patient verbalized understanding. If symptoms are worsening or not improving the patient should return to the [facility] for further evaluation.    I am scribing for, and in the presence of, Dr. SGuy Francofor services provided on 09/23/2018.  JCelene Squibb SCRIBE   [place]netta [place]1/10/2018, 15:32    I personally performed the services described in this documentation, as scribed  in my presence, and it is both accurate  and complete.    SGuy Franco DO

## 2018-09-23 NOTE — Progress Notes (Signed)
[facility], HEALTH/EDUCATION BLDG  Page [id number]    PATIENT NAME:  Barbara Graves  MRN:  [id number]  DOB:  08-22-1996  DATE OF SERVICE: 09/23/2018    Chief Complaint   Patient presents with   . Vaginal Discharge     x 3 weeks, pt was treated for BV and discharge started after meds    . Other     Pt states she passed out on New Years Eve x 3 times       History of Present Illness: Barbara Graves is a 23 y.o. female who presents to [facility] today for the above complaint. Patient here with c/o vaginal discharge x 2.5 weeks. Pt was treated for BV and + CT 3 weeks ago. Partner was treated and complaint with no sex x 7 days.  Last sexually active 2 months ago.    Pt also has concern about headache that would happen when she laid down.  Then night of new years she had a headache. She passed out at a club. Does admit to alcohol and marijuana use that night.    HPI      On average, how many days/week do you engage in moderate to vigorous physical activity (like brisk walking)?: 0 Days      Past Medical History:    Past Medical History:   Diagnosis Date   . STD (sexually transmitted disease)     HVS2         Past Surgical History:    Past Surgical History:   Procedure Laterality Date   . HX NO SURGICAL PROCEDURES           Allergies:  No Known Allergies  Medications:  Outpatient Medications Marked as Taking for the 09/23/18 encounter (Office Visit) with Dema Severin, APRN,WHNP-BC   Medication Sig   . L norgesterol-E estradiol/E estradiol (ASHLYNA) 0.15 mg-30 mcg/10 mcg Oral tablet Take 1 Tab by mouth Once a day   . valACYclovir (VALTREX) 500 mg Oral Tablet Take 1 Tab (500 mg total) by mouth Twice daily   . valACYclovir (VALTREX) 500 mg Oral Tablet Take 1 Tab (500 mg total) by mouth Twice daily     Social History:    Social History     Tobacco Use   . Smoking status: Never Smoker   . Smokeless tobacco: Never Used   Substance Use Topics   .  Alcohol use: Never     Frequency: Never      Family History:  Family Medical History:     Problem Relation (Age of Onset)    No Known Problems Mother, Father, Maternal Grandmother, Maternal Grandfather, Paternal 69, Paternal Grandfather            Review of Systems:  Review of Systems   Constitutional: Negative for chills and fever.   Gastrointestinal: Negative for nausea and vomiting.   Genitourinary: Positive for vaginal discharge and vaginal pain. Negative for dysuria, flank pain, genital sores, hematuria and vaginal bleeding.     Physical Exam:  Vitals:    09/23/18 1437   BP: 123/83   Pulse: 84   Resp: 18   Temp: 36.7 C (98 F)   TempSrc: Tympanic   SpO2: 97%   Weight: 49.9 kg (110 lb)   Height: 1.575 m (5\' 2" )   BMI: 20.16         Body mass index is 20.12 kg/m.  Physical  Exam  Exam conducted with a chaperone present.   Constitutional:       Appearance: She is well-developed.   HENT:      Head: Normocephalic and atraumatic.   Genitourinary:     Exam position: Lithotomy position.      Labia:         Right: No rash, tenderness or lesion.         Left: No rash, tenderness or lesion.       Vagina: Bleeding present. No erythema or tenderness.      Cervix: No discharge or friability.      Uterus: Not enlarged and not tender.       Adnexa:         Right: No mass, tenderness or fullness.          Left: No mass, tenderness or fullness.     Musculoskeletal: Normal range of motion.   Skin:     General: Skin is warm and dry.   Neurological:      Mental Status: She is alert and oriented to person, place, and time.   Psychiatric:         Behavior: Behavior normal.         Thought Content: Thought content normal.         Judgment: Judgment normal.       Ortho Exam    Data Reviewed:  Provider Performed POCT 03/31/2018 09/03/2018 09/23/2018   [facility] [address], [place], [place] [address] [facility] [address] [place], [place] [address] [facility] [address], [place], [place] [address]      Time Thu Sep 23, 2018  3:43 PM Thu Sep 23, 2018  3:43 PM Thu Sep 23, 2018  3:43 PM   Patient Gender - Female Female   Time -  2:10 PM  3:05 PM   Trichomonas Test  None Seen None Seen None Seen   Trichomonas Reference Range (Required) None Seen None Seen None Seen   Yeast Test  Seen None Seen None Seen   Yeast Ref Range(Required) None Seen None Seen None Seen   Clue Cells Test None Seen Seen None Seen   Clue Cells Ref Range (Required) None Seen None Seen None Seen   Specimen Source Vaginal Fluid Vaginal Fluid Vaginal Fluid   Additional Info positive WBC's - -     Urine pregnancy negative   UML labs ordered    Assessment:   1. Irregular bleeding    2. Light headedness      Plan:    Orders Placed This Encounter   . POCT VAGINAL KOH & SALINE ([facility] ONLY)   . POCT URINE PREGNANCY   Return if symptoms worsen or fail to improve.   Continue taking OCs at the same time everyday   If irregular bleeding continues then consider switching OCs     Dema Severin, APRN,WHNP-BC  The co-signing faculty was physically present in [facility] and available for consultation and did not particpate in the care of this patient.    Please see separate note for evaluation of syncope.  I did not evaluate pt for irregular bleeding/vaginal discharge.  See APRN note as above.    Guy Franco, DO  09/25/2018, 08:03

## 2018-09-26 LAB — EBV ANTIBODY PROFILE
EBNA ANTIBODY, QUALITATIVE: POSITIVE — AB
EBV VCA IGG ANTIBODY, QUALITATIVE: POSITIVE — AB
EBV VCA IGM ANTIBODY, QUALITATIVE: NEGATIVE

## 2018-10-25 ENCOUNTER — Encounter (FREE_STANDING_LABORATORY_FACILITY)
Admit: 2018-10-25 | Discharge: 2018-10-25 | Disposition: A | Discharge status: Home / Self Care | Payer: BC Managed Care – PPO | Attending: Women's Health | Admitting: Women's Health

## 2018-10-25 ENCOUNTER — Ambulatory Visit (INDEPENDENT_AMBULATORY_CARE_PROVIDER_SITE_OTHER): Payer: BC Managed Care – PPO | Admitting: Women's Health

## 2018-10-25 ENCOUNTER — Other Ambulatory Visit: Payer: Self-pay

## 2018-10-25 ENCOUNTER — Encounter (FREE_STANDING_LABORATORY_FACILITY): Payer: BC Managed Care – PPO | Admitting: Women's Health

## 2018-10-25 ENCOUNTER — Encounter (INDEPENDENT_AMBULATORY_CARE_PROVIDER_SITE_OTHER): Payer: Self-pay | Admitting: Women's Health

## 2018-10-25 VITALS — BP 129/83 | HR 72 | Temp 98.0°F | Resp 18 | Ht 62.0 in | Wt 111.0 lb

## 2018-10-25 DIAGNOSIS — Z7251 High risk heterosexual behavior: Secondary | ICD-10-CM

## 2018-10-25 DIAGNOSIS — Z113 Encounter for screening for infections with a predominantly sexual mode of transmission: Secondary | ICD-10-CM | POA: Insufficient documentation

## 2018-10-25 DIAGNOSIS — B373 Candidiasis of vulva and vagina: Secondary | ICD-10-CM

## 2018-10-25 DIAGNOSIS — B3731 Acute candidiasis of vulva and vagina: Secondary | ICD-10-CM

## 2018-10-25 DIAGNOSIS — Z6820 Body mass index (BMI) 20.0-20.9, adult: Secondary | ICD-10-CM

## 2018-10-25 DIAGNOSIS — N898 Other specified noninflammatory disorders of vagina: Secondary | ICD-10-CM

## 2018-10-25 MED ORDER — FLUCONAZOLE 150 MG TABLET
150.0000 mg | ORAL_TABLET | ORAL | 0 refills | Status: AC
Start: 2018-10-25 — End: 2018-11-01

## 2018-10-25 NOTE — Patient Instructions (Signed)
Preventing Vaginal Infection  These steps can help you stay comfortable during treatment of a vaginal infection. They also help prevent vaginal infections in the future.  Keeping a healthy balance  Factors that change the normal balance in the vagina can lead to a vaginal infection. To help keep the balance normal, try these tips:   Change out of wet bathing suits and damp exercise clothes as soon as possible. Yeast thrive in a warm, moist environment.   Avoid wearing tight pants. Choose cotton underwear and pantyhose that have a cotton crotch. Cotton keeps you cooler and drier than synthetics.   Don't douche unless directed by your healthcare provider. Douching can destroy friendly bacteria and change the vagina's normal balance.   Wipe from front to back after using the toilet. This prevents bacteria from spreading from the anus to the vulva.   Wash the vulva with mild, unscented soap or with plain water.   Wash your diaphragm, spermicide applicators, and sex toys with mild soap and water after use. Dry them thoroughly before putting them away.   Change tampons often (every 2 hoursto 4 hours). Leaving a tampon in for too long may disrupt the balance of vaginal bacteria.   Avoid vaginal sprays, scented toilet paper and soaps, and deodorant tampons or pads, which can cause vaginal irritation  Staying healthy overall  Good overall health can help you resist infection. To be healthier:   Help protect yourself from STIs (sexually transmitted infections) by using latex condoms for intercourse. Ask your healthcare provider for more information about safer sex.   Eat a variety of healthy foods.   Exercise regularly.   Get enough rest and sleep.   Maintain a healthy weight. If you need to lose weight, ask your healthcare provider for advice on how to start.  StayWell last reviewed this educational content on 05/23/2016   2000-2019 [employer]. [address], [place] [address]. All  rights reserved. This information is not intended as a substitute for professional medical care. Always follow your healthcare professional's instructions.        Vaginal Infection: Understanding the Vaginal Environment  The vagina is a canal. It connects the uterus (womb) to the outside of the body.It is home to many types of bacteria and other tiny organisms. These different bacteria most often stay balanced in number. This keeps the vagina healthy. If the balance changes, it can cause infection.  A healthy environment  Many types of bacteria are present in a healthy vagina. When balanced, they don't cause problems. Small amounts of yeast may also be present without causing problems. The most common type of bacteria in the vagina is lactobacillus. It helps keep the vagina at a low pH. A low pH keeps bad bacteria from taking over.  Normal vaginal discharge  The vagina makes fluid. It is sent out as discharge. This also keeps the vagina healthy. Normal discharge can be clear, white, or yellowish. Most women find that normal discharge varies in amount and color through the month.  An unhealthy environment  The vaginal environment may get out of balance. This may result in a vaginal infection. There are a few reasons this can happen. The pH may have changed. The amount of one organism, such as yeast, may increase. Or an outside organism may get into the vagina and throw off the balance:   Bacterial vaginosis (BV). BV is due to an imbalance in the normal bacteria in the vagina. Lactobacillus bacteria decrease.  As a result, the numbers of bad bacteria increase.   Candidiasis (yeast infection). Yeast is a type of fungus. A yeast infection occurs when yeast cells in the vagina increase. They then attack vaginal tissues. A type of yeast called Candida albicans is often involved.   Trichomoniasis ("trich"). Dalbert Batman is a parasite. It is passed from one person to another during sex. Men with trich often don't have any  symptoms. In women, it can take weeks or months before symptoms appear.  StayWell last reviewed this educational content on 11/21/2015   2000-2019 [employer] [place]. [address], [place] [address]. All rights reserved. This information is not intended as a substitute for professional medical care. Always follow your healthcare professional's instructions.    Barbara Graves     Operated by [facility]  [place], [place] [address]  Phone: [phone]  Fax: [phone]  [email or web address]  Twitter [email or web address]   Closed all  Holidays        Attending Caregiver: Barbaraann Barthel, APRN,WHNP-BC    Today's orders: No orders of the defined types were placed in this encounter.        Prescription(s) E-Rx to:  [id number] [place], [place]    Future appts with [facility]  Visit date not found    [facility]. THIS IS ONE EASY WAY TO COMMUNICATE WITH [facility].  SEE THE CODE ON THIS FORM FOR ACCESS.    -----------------------------PRIVACY INFORMATION-----------------------------------------------  As a [employer] student, regardless of your age, we cannot discuss your personal health information with a parent, spouse, family member or anyone else without your expressed consent.    This policy does not include:  Individuals who would have a legitimate reason to access your records and information to assist in your care under the provisions of HIPAA ([place]) law;   Individuals with whom you have previously given expressed written consent to do so, such a legal guardian or Power of Attorney.    No one can access your [facility], unless you give them your account sign on and password. You will receive emails from [facility].  This means anyone who has access to the email account you provided can see this notification. There will be no  private medical information included in these emails. This notification of  new medical information  available in your [facility], may be information that you do not want others to know.   _______________________________________________________________________    If your symptoms persist, worsen or you develop any new or concerning symptoms please call [facility] for at [phone] for a follow up appointment.   If your symptoms are severe, go immediately to the emergency department or call 911.  Please check with your health insurance coverage for these types of visits.   Please keep all follow up visit recommended at today's visit.    If you received x-rays during your visit, be aware that the final and formal interpretation of those films by a radiologist will occur after your discharge.  If there is a significant discrepancy identified after your discharge, we will contact you at the telephone number provided during registration.  These results are available for your review on [facility].    Please refer to your [facility] for lab test results. Lab test results related to HIV, STI's and pregnancy are considered private and are therefore not immediately visible in your [facility], we may still  be able to send a generic MyChart message for these results.  If you have cultures pending for sexually transmitted diseases, you will be contacted by phone.     Positive cultures are reported to the [place] [facility], as required by state law. These results are considered private on [facility] and can only be released by the provider.We will not contact you if the results are negative.    It is very important that we have a phone number that is the single best way to contact you in the event that we become aware of important clinical information or concerns after your discharge.  If the phone number you provided at registration is NOT this number you should inform staff and registration prior to leaving.        Please call [facility] at [phone] with any further questions.    Instructions discussed with patient upon discharge by clinical staff with all questions answered.    Barbaraann Barthel, APRN,WHNP-BC 10/25/2018, 16:54

## 2018-10-25 NOTE — Progress Notes (Signed)
Barbara Graves, HEALTH/EDUCATION BLDG  [place] [address]    PATIENT NAME:  Barbara Graves  MRN:  [id number]  DOB:  [birth year]  DATE OF SERVICE: 10/25/2018    Chief Complaint   Patient presents with   . STI Screening     retest for chlamydia,    . Yeast Infection       History of Present Illness: Barbara Graves is a 23 y.o. female who presents to [facility] today for the above complaint. Needs a re-screen for Chlamydia from 12/19  HPI  Pt's symptoms started about 3-4 days ago.   Patient admits to   new onset of vaginal discharge. Pt denies frequency, urgency.   Patient denies  exposure to STI  Known exposure to chlamydia .   Patient has history of  chlamydia greater than 2 months ago that was  azithromycin.   Last sexual contact 3Months ago with 1 partners.  Was this contact consensual? yes   Contact protected: no   Condom use: 100%-75%of the time.   Sexual partners in last 60 days: 0  Lifetime sexual partners: 65  Gender of current/past partners Female.  Contraception method: oral contraceptives  Participates in oral, vaginal, anal sex.   Hx of IVDU or intranasal DU in self or past/present partners: no  Has paid or has been paid for sex in the past: no  Have you completed the HPV vaccine series: yes         Past Medical History:    Past Medical History:   Diagnosis Date   . STD (sexually transmitted disease)     HVS2         Past Surgical History:    Past Surgical History:   Procedure Laterality Date   . HX NO SURGICAL PROCEDURES           Allergies:  No Known Allergies  Medications:  Outpatient Medications Marked as Taking for the 10/25/18 encounter (Office Visit) with Barbaraann Barthel, APRN,WHNP-BC   Medication Sig   . fluconazole (DIFLUCAN) 150 mg Oral Tablet Take 1 Tab (150 mg total) by mouth Every 3 days for 3 doses   . L norgesterol-E estradiol/E estradiol (ASHLYNA) 0.15 mg-30 mcg/10 mcg Oral tablet Take 1 Tab by mouth Once a day     Social  History:    Social History     Tobacco Use   . Smoking status: Never Smoker   . Smokeless tobacco: Never Used   Substance Use Topics   . Alcohol use: Never     Frequency: Never      Family History:  Family Medical History:     Problem Relation (Age of Onset)    No Known Problems Mother, Father, Maternal Grandmother, Maternal Grandfather, Paternal 49, Paternal Grandfather            Review of Systems:  Review of Systems   Genitourinary: Positive for vaginal discharge. Negative for genital sores, pelvic pain and vaginal pain.        Vaginal itching   All other systems reviewed and are negative.    Physical Exam:  Vitals:    10/25/18 1639   BP: 129/83   Pulse: 72   Resp: 18   Temp: 36.7 C (98 F)   TempSrc: Thermal Scan   SpO2: 98%   Weight: 50.3 kg (111 lb)   Height: 1.575 m (5\' 2" )   BMI: 20.34  Body mass index is 20.3 kg/m.  Physical Exam  Vitals signs reviewed. Exam conducted with a chaperone present.   Constitutional:       Appearance: Normal appearance.   HENT:      Head: Normocephalic.      Mouth/Throat:      Mouth: Mucous membranes are moist.   Eyes:      Pupils: Pupils are equal, round, and reactive to light.   Neck:      Musculoskeletal: Normal range of motion.   Pulmonary:      Effort: Pulmonary effort is normal.   Abdominal:      Palpations: Abdomen is soft.   Genitourinary:     Labia:         Right: No rash, tenderness, lesion or injury.         Left: No rash, tenderness, lesion or injury.       Vagina: No signs of injury and foreign body. Vaginal discharge present. No erythema, tenderness, bleeding, lesions or prolapsed vaginal walls.      Cervix: Discharge present. No cervical motion tenderness, friability, lesion, erythema, cervical bleeding or eversion.      Uterus: Normal.       Adnexa: Right adnexa normal and left adnexa normal.   Musculoskeletal: Normal range of motion.   Skin:     General: Skin is warm and dry.   Neurological:      Mental Status: She is alert and oriented to  person, place, and time.   Psychiatric:         Mood and Affect: Mood normal.         Behavior: Behavior normal.         Thought Content: Thought content normal.         Judgment: Judgment normal.       Ortho Exam    Data Reviewed:    POCT Results:       Provider Performed POCT 03/31/2018 09/03/2018 09/23/2018 10/25/2018   [facility] [address], [place], [place] [address] [facility] [address], [place], [place] [address] [facility] [address] [place], [place] [address] [facility] [address] [place], [place] [address]   Time Mon Oct 25, 2018  5:40 PM Mon Oct 25, 2018  5:40 PM Mon Oct 25, 2018  5:40 PM Mon Oct 25, 2018  5:40 PM   Patient Gender - Female Female -   Time -  2:10 PM  3:05 PM -   Trichomonas Test  None Seen None Seen None Seen None Seen   Trichomonas Reference Range (Required) None Seen None Seen None Seen None Seen   Yeast Test  Seen None Seen None Seen Seen   Yeast Ref Range(Required) None Seen None Seen None Seen None Seen   Clue Cells Test None Seen Seen None Seen None Seen   Clue Cells Ref Range (Required) None Seen None Seen None Seen None Seen   Specimen Source Vaginal Fluid Vaginal Fluid Vaginal Fluid Vaginal Fluid   Additional Info positive WBC's - - -            I have reviewed and confirmed the above point of care results.  Barbaraann Barthel, APRN,WHNP-BC 10/25/2018, 17:40  UML labs ordered    Assessment:   1. Routine screening for STI (sexually transmitted infection)    2. Unprotected sex    3. Vaginal discharge    4. Vaginal itching    5. Vaginal yeast infection  Plan:    Orders Placed This Encounter   . NEISSERIA GONORRHOEAE DNA BY PCR   . CHLAMYDIA TRACHOMITIS DNA BY PCR (INHOUSE)   . POCT VAGINAL KOH & SALINE ([facility] ONLY)   . fluconazole (DIFLUCAN) 150 mg Oral Tablet     STD testing will be back in a few days and we will call with results all positive STDs are reported to the [facility] per [facility] law. The best way to protect yourself from STDs is  reducing number of partners and 100% condom use   Discussed vaginal hygiene, avoid scented products.take Diflucan as directed.     Return if symptoms worsen or fail to improve.  Barbaraann Barthel, APRN,WHNP-BC  The co-signing faculty was physically present in [facility] and available for consultation and did not particpate in the care of this patient.

## 2018-10-27 LAB — NEISSERIA GONORRHOEAE DNA BY PCR: NEISSERIA GONORRHOEAE PCR: NOT DETECTED

## 2018-10-27 LAB — CHLAMYDIA TRACHOMITIS DNA BY PCR (INHOUSE): CHLAMYDIA TRACHOMATIS PCR: NOT DETECTED

## 2018-12-13 ENCOUNTER — Encounter (FREE_STANDING_LABORATORY_FACILITY): Payer: BC Managed Care – PPO | Admitting: Women's Health

## 2018-12-13 ENCOUNTER — Other Ambulatory Visit: Payer: Self-pay

## 2018-12-13 ENCOUNTER — Encounter (FREE_STANDING_LABORATORY_FACILITY)
Admit: 2018-12-13 | Discharge: 2018-12-13 | Disposition: A | Discharge status: Home / Self Care | Payer: BC Managed Care – PPO | Attending: Women's Health | Admitting: Women's Health

## 2018-12-13 ENCOUNTER — Ambulatory Visit (INDEPENDENT_AMBULATORY_CARE_PROVIDER_SITE_OTHER): Payer: BC Managed Care – PPO

## 2018-12-13 ENCOUNTER — Ambulatory Visit (INDEPENDENT_AMBULATORY_CARE_PROVIDER_SITE_OTHER): Payer: BC Managed Care – PPO | Admitting: Women's Health

## 2018-12-13 ENCOUNTER — Encounter (INDEPENDENT_AMBULATORY_CARE_PROVIDER_SITE_OTHER): Payer: Self-pay | Admitting: Women's Health

## 2018-12-13 VITALS — BP 122/81 | HR 108 | Temp 98.0°F | Resp 16 | Ht 62.0 in | Wt 115.0 lb

## 2018-12-13 VITALS — Temp 98.0°F

## 2018-12-13 DIAGNOSIS — B3731 Acute candidiasis of vulva and vagina: Secondary | ICD-10-CM

## 2018-12-13 DIAGNOSIS — B373 Candidiasis of vulva and vagina: Secondary | ICD-10-CM

## 2018-12-13 DIAGNOSIS — Z111 Encounter for screening for respiratory tuberculosis: Secondary | ICD-10-CM

## 2018-12-13 DIAGNOSIS — Z7251 High risk heterosexual behavior: Secondary | ICD-10-CM

## 2018-12-13 DIAGNOSIS — N898 Other specified noninflammatory disorders of vagina: Secondary | ICD-10-CM | POA: Insufficient documentation

## 2018-12-13 DIAGNOSIS — Z113 Encounter for screening for infections with a predominantly sexual mode of transmission: Secondary | ICD-10-CM | POA: Insufficient documentation

## 2018-12-13 MED ORDER — TERCONAZOLE 0.4 % VAGINAL CREAM
1.00 | TOPICAL_CREAM | Freq: Every evening | VAGINAL | 0 refills | Status: DC
Start: 2018-12-13 — End: 2019-01-20

## 2018-12-13 NOTE — Patient Instructions (Signed)
Tuberculin purified protein derivative, PPD injection   What is this medicine?   TUBERCULIN PURIFIED PROTEIN DERIVATIVE, PPD is a test used to detect if you have a tuberculosis infection. It will not cause tuberculosis infection.   This medicine may be used for other purposes; ask your health care provider or pharmacist if you have questions.   COMMON BRAND NAME(S): Aplisol, Tubersol     What should I tell my health care provider before I take this medicine?   They need to know if you have any of these conditions:   -diabetes   -HIV or AIDS   -immune system problems   -infection (especially a virus infection such as chickenpox, cold sores, or herpes)   -kidney disease   -malnutrition   -an unusual or allergic reaction to tuberculin purified protein derivative, PPD, other medicines, foods, dyes, or preservatives   -pregnant or trying to get pregnant   -breast-feeding     How should I use this medicine?   This medicine is for injection in the skin. It is given by a health care professional in a hospital or clinic setting.   Talk to your pediatrician regarding the use of this medicine in children. While this drug may be prescribed in children, precautions do apply.   Overdosage: If you think you've taken too much of this medicine contact a poison control center or emergency room at once.   Overdosage: If you think you have taken too much of this medicine contact a poison control center or emergency room at once.   NOTE: This medicine is only for you. Do not share this medicine with others.     What if I miss a dose?   It is important not to miss your dose. Call your doctor or health care professional if you are unable to keep an appointment.     What may interact with this medicine?   -adalimumab   -anakinra   -etanercept   -infliximab   -live virus vaccines   -medicines to treat cancer   -steroid medicines like prednisone or cortisone   This list may not describe all possible interactions. Give your health care  provider a list of all the medicines, herbs, non-prescription drugs, or dietary supplements you use. Also tell them if you smoke, drink alcohol, or use illegal drugs. Some items may interact with your medicine.     What should I watch for while using this medicine?   See your health care provider as directed.   This medicine does not protect against tuberculosis.     What side effects may I notice from receiving this medicine?   Side effects that you should report to your doctor or health care professional as soon as possible:   -allergic reactions like skin rash, itching or hives, swelling of the face, lips, or tongue   -breathing problems   Side effects that usually do not require medical attention (Report these to your doctor or health care professional if they continue or are bothersome.):   -bruising   -pain, redness, or irritation at site where injected   This list may not describe all possible side effects. Call your doctor for medical advice about side effects. You may report side effects to FDA at 1-800-FDA-1088.     Where should I keep my medicine?   This drug is given in a hospital or clinic and will not be stored at home.   NOTE: This sheet is a summary. It may not cover all   possible information. If you have questions about this medicine, talk to your doctor, pharmacist, or health care provider.    2013, [employer]. (11/01/2009 1:24:18 PM)

## 2018-12-13 NOTE — Progress Notes (Signed)
[place], [facility]  [place] [address]    PATIENT NAME:  Barbara Graves  MRN:  [id number]  DOB:  1996/08/24  DATE OF SERVICE: 12/13/2018    Chief Complaint   Patient presents with   . STD Screening   . Vaginal Discharge       History of Present Illness: Barbara Graves is a 23 y.o. female who presents to [facility] today for the above complaint.  HPI    Patient admits to   new onset of vaginal discharge and irritation x 2 months intermittently  Patient denies  exposure to STI  Known exposure to no known diseases.   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   Last sexual contact 46Month ago with 1 partners.  Was this contact consensual? yes   Contact protected: no   Condom use: 100%-75%of the time.   Sexual partners in last 60 days: 0  Lifetime sexual partners: 35  Gender of current/past partners Female.  Contraception method: oral contraceptives  Participates in oral, vaginal and anal sex.   Hx of IVDU or intranasal DU in self or past/present partners: no  Has paid or has been paid for sex in the past: no  Have you completed the HPV vaccine series: yes         Past Medical History:    Past Medical History:   Diagnosis Date   . STD (sexually transmitted disease)     HVS2         Past Surgical History:    Past Surgical History:   Procedure Laterality Date   . HX NO SURGICAL PROCEDURES           Allergies:  No Known Allergies  Medications:  Outpatient Medications Marked as Taking for the 12/13/18 encounter (Office Visit) with Barbaraann Barthel, APRN,WHNP-BC   Medication Sig   . terconazole (TERAZOL 7) 0.4 % Vaginal Cream 1 Applicator by Vaginal route Every night     Social History:    Social History     Tobacco Use   . Smoking status: Never Smoker   . Smokeless tobacco: Never Used   Substance Use Topics   . Alcohol use: Never      Frequency: Never      Family History:  Family Medical History:     Problem Relation (Age of Onset)    No Known Problems Mother, Father, Maternal Grandmother, Maternal Grandfather, Paternal 62, Paternal Grandfather            Review of Systems:  Review of Systems   Genitourinary: Positive for vaginal discharge and vaginal pain. Negative for dyspareunia, genital sores, pelvic pain and vaginal bleeding.   All other systems reviewed and are negative.    Physical Exam:  Vitals:    12/13/18 1140   BP: 122/81   Pulse: (!) 108   Resp: 16   Temp: 36.7 C (98 F)   TempSrc: Thermal Scan   SpO2: 99%   Weight: 52.2 kg (115 lb)   Height: 1.575 m (5\' 2" )   BMI: 21.08         Body mass index is 21.03 kg/m.  Physical Exam  Vitals signs reviewed. Exam conducted with a chaperone present.   HENT:      Head: Normocephalic and atraumatic.  Nose: Nose normal.      Mouth/Throat:      Mouth: Mucous membranes are moist.   Eyes:      Pupils: Pupils are equal, round, and reactive to light.   Neck:      Musculoskeletal: Normal range of motion.   Pulmonary:      Effort: Pulmonary effort is normal.   Abdominal:      Palpations: Abdomen is soft.   Genitourinary:     Labia:         Right: No rash, tenderness, lesion or injury.         Left: No rash, tenderness, lesion or injury.       Vagina: No signs of injury and foreign body. Vaginal discharge, erythema and tenderness present. No bleeding, lesions or prolapsed vaginal walls.      Cervix: Normal.      Uterus: Normal.       Adnexa: Right adnexa normal and left adnexa normal.   Musculoskeletal: Normal range of motion.   Skin:     General: Skin is warm and dry.   Neurological:      Mental Status: She is alert and oriented to person, place, and time.   Psychiatric:         Mood and Affect: Mood normal.         Behavior: Behavior normal.         Thought Content: Thought content normal.         Judgment: Judgment normal.       Ortho Exam    Data Reviewed:    POCT Results:       Provider  Performed POCT 03/31/2018 09/03/2018 09/23/2018 10/25/2018 12/13/2018   [facility] [address], [place], [place] [address] [facility] [address], [place], [place] [address] [facility] [address] [place], [place] [address] [facility] [address], [place], [place] [address] [facility] [address], [place], [place] [address]   Time Mon Dec 13, 2018  2:18 PM Mon Dec 13, 2018  2:18 PM Mon Dec 13, 2018  2:18 PM Mon Dec 13, 2018  2:18 PM Mon Dec 13, 2018  2:18 PM   Patient Gender - Female Female - -   Time -  2:10 PM  3:05 PM - -   Trichomonas Test  None Seen None Seen None Seen None Seen None Seen   Trichomonas Reference Range (Required) None Seen None Seen None Seen None Seen None Seen   Yeast Test  Seen None Seen None Seen Seen Seen   Yeast Ref Range(Required) None Seen None Seen None Seen None Seen None Seen   Clue Cells Test None Seen Seen None Seen None Seen None Seen   Clue Cells Ref Range (Required) None Seen None Seen None Seen None Seen None Seen   Specimen Source Vaginal Fluid Vaginal Fluid Vaginal Fluid Vaginal Fluid Vaginal Fluid   Additional Info positive WBC's - - - -          I have reviewed and confirmed the above point of care results.  Barbaraann Barthel, APRN,WHNP-BC 12/13/2018, 14:18  [facility] labs ordered    Assessment:   1. Routine screening for STI (sexually transmitted infection)    2. Unprotected sex    3. Vaginal discharge    4. Vaginal irritation    5. Vaginal yeast infection      Plan:    Orders Placed This Encounter   . CHLAMYDIA TRACHOMITIS DNA BY  PCR (INHOUSE)   . NEISSERIA GONORRHOEAE DNA BY PCR   . POCT VAGINAL KOH & SALINE ([facility] ONLY)   . terconazole (TERAZOL 7) 0.4 % Vaginal Cream     STD testing will be back in a few days and we will call with results all positive STDs are reported to the [place] per [facility] law. The best way to protect yourself from STDs is reducing number of partners and 100% condom use   Discussed vaginal hygiene, avoid  scented products.  Use terazol as directed.   Return if symptoms worsen or fail to improve.  Barbaraann Barthel, APRN,WHNP-BC  The co-signing faculty was physically present in [facility] and available for consultation and did not particpate in the care of this patient.

## 2018-12-13 NOTE — Patient Instructions (Addendum)
If You Think You Have an STI (STD)  Treatinga sexually transmitted infection (STI) early limits the problems it can cause and helps prevent its spread to others. An STI is also known as a sexually transmitted disease (STD). If you have an STI, get treated right away. Ask your partner to be tested, too. Then avoid sex until you've finished treatment and your healthcare provider says it'sOK to have sex again.  Follow your treatment plan  Treatment depends on the type of STI you have. Common treatments include injections and oral pills or liquids. Creams and gels can be applied to sores or warts caused by certain STIs. Follow the tips below:   Get new treatment for each new STI.   Don't use old medicine, even for the same STI. Use medicines as directed.   Don't share medicine unless instructed to do so by your healthcare provider or clinic.  Talk to your partner  If you have an STI, it's your duty to tell all your recent partners so they can be tested and treated. This is one important way to prevent the disease from being spread. Telling a partner that you have an STI can be hard. You may be embarrassed, angry, or afraid. It's often unclear who had the STI first. So try not to place blame. Your healthcare provider may offer some advice on how to start.  Prevent future problems  Even after you've been treated, you can still be infected again. This is a common problem. It can happen if a partner passes the STI back to you. To prevent this, your partner or partners must be tested. He or she may also need treatment. After treatment, go to any scheduled follow-up visits. Then prevent future problems with safer sex. Limit your number of partners and always use a latex condom.  Diagnosing STIs  Your healthcare provider will take a health history and examine you. During your health history, you will be asked about your sex habits and health history. You may also be asked about drug use. Give honest answers. Your  healthcare provider will then check your body for signs of STIs. He or she also may do one or more of the following tests:   Fluid may be swabbed from sores. Samples also may be taken from the vagina, penis, mouth, or rectum. The samples are then tested for STIs.   Blood or urine samples may be taken. They are checked for viruses or bacteria that cause STIs.   For women, cells from the cervix are checked for signs of cancer and the genital wart virus (human papillomavirus, or HPV). This is called a Pap smear, and is often now done along with HPV testing. If cell changes are found, or a high-risk type of HPV is found, a magnifying scope may be used to take a closer look (colposcopy). In men and women, a Pap smear may be done on the anus to check for HPV-linked cancer or precancerous changes. This is done by a healthcare provider gently swabbing cells from the lining of the anus, and then sending the sample to be looked at in the lab. If there are signs of abnormality, you may need more testing.  StayWell last reviewed this educational content on 10/23/2017   2000-2019 [employer]. [address] [place] [address]. All rights reserved. This information is not intended as a substitute for professional medical care. Always follow your healthcare professional's instructions.        Understanding  STDs    When it comes to sex, nothing is risk-free. Any sexual contact with the penis, vagina, anus, or mouth can spread a sexually transmitted disease (STD). The only sure way to prevent STDs is abstinence (not having sex). But there are ways to make sex safer. Use a latex condom each time you have sex. And choose your partner wisely.  Use condoms for safer sex  If you have sex, latex condoms provide the best protection against STDs. Latex condoms stop the exchange of body fluids that carry certain STDs. They also limit contact with affected skin. Be aware though, a condom doesn't cover all skin. So,  affected skin that is not covered can still transfer disease. But you're safer with a condom than without one. Use a condom even if you use other birth control. While birth control methods like the pill or IUD help prevent pregnancy, they do not protect against STDs.  Choose the right condom  Condoms made of latex prevent disease best. If you're allergic to latex, use polyurethane condoms instead. Female condoms fit over the penis. Female condoms line the vagina. Before buying a condom, read the label to be sure it prevents disease. Some novelty condoms don't.  The right lubricant helps  Buy lubricated condoms or use lubricant. This provides greater comfort and reduces the risk of condom breakage. Use only water-based lubricants. Don't use oil, lotion, or petroleum jelly. They can weaken the condom, causing breakage. Also, you may want to choose lubricants without nonoxynol-9. It's now known that this spermicide does not prevent disease and may cause irritation.  Use condoms correctly  For condoms to work, they must be used the right way. Keep these tips in mind:   Use a new latex condom each time you have sex. Slip the condom on the penis before any contact is made.   When ready to withdraw, hold the rim of the condom as the penis pulls out. This prevents the condom from slipping off.   Check the expiration date before using a condom.   Don't store condoms in places that can get hot, such as a car or a wallet that is carried in a back pocket.  Get to know your partner  Safer sex is a process. It involves getting to know your partner and making informed choices. Ask each other how many partners you have had in the past, and how many you have now. Find out if either of you has an STD. If you decide to have sex, use a condom each time. Don't stop using condoms unless you're sure neither of you has other partners and you've both been tested to confirm you don't have STDs. Then stay free of disease by having sex only  with each other (monogamy).  Keep your cool  Don't let alcohol or drugs cloud your judgment. They could lead you to have sex with someone you wouldn't have chosen if you were sober. Or, you might forget to use a condom. If you do plan to have sex, keep a latex condom with you. Don't wait until you're in the heat of passion to try to find one.  Consider abstinence  The only way to be sure you won't get an STD is to abstain from sex. Abstinence is a choice that many people make at some point in their lives. Maybe you want to wait until you are sure you're ready before you have sex. Maybe you'd like a break from the responsibilities of sex for  a while. Or maybe you just want to know your partner better before taking the next step. Abstinence is a choice you can make now to protect your future.  StayWell last reviewed this educational content on 08/23/2015   2000-2019 [employer]. [address], [place] [address]. All rights reserved. This information is not intended as a substitute for professional medical care. Always follow your healthcare professional's instructions.        [place]     Operated by [facility]  [place], [place] [address]  Phone: [phone]  Fax: [phone]  [email or web address]  Twitter [email or web address]   Closed all  Holidays        Attending Caregiver: Barbaraann Barthel, APRN,WHNP-BC    Today's orders: No orders of the defined types were placed in this encounter.        Prescription(s) E-Rx to:  [id number] [place], [place]    Future appts with Student Health  Visit date not found    [place]. THIS IS ONE EASY WAY TO COMMUNICATE WITH [facility].  SEE THE CODE ON THIS FORM FOR ACCESS.    -----------------------------PRIVACY INFORMATION-----------------------------------------------  As a [employer] student, regardless of your age, we cannot  discuss your personal health information with a parent, spouse, family member or anyone else without your expressed consent.    This policy does not include:  Individuals who would have a legitimate reason to access your records and information to assist in your care under the provisions of HIPAA ([place] and [place]) law;   Individuals with whom you have previously given expressed written consent to do so, such a legal guardian or Power of Attorney.    No one can access your My[facility], unless you give them your account sign on and password. You will receive emails from [place].  This means anyone who has access to the email account you provided can see this notification. There will be no private medical information included in these emails. This notification of  new medical information  available in your [facility], may be information that you do not want others to know.   _______________________________________________________________________    If your symptoms persist, worsen or you develop any new or concerning symptoms please call [facility] for at [phone] for a follow up appointment.   If your symptoms are severe, go immediately to the emergency department or call 911.  Please check with your health insurance coverage for these types of visits.   Please keep all follow up visit recommended at today's visit.    If you received x-rays during your visit, be aware that the final and formal interpretation of those films by a radiologist will occur after your discharge.  If there is a significant discrepancy identified after your discharge, we will contact you at the telephone number provided during registration.  These results are available for your review on [facility].    Please refer to your [facility] for lab test results. Lab test results related to HIV, STI's and pregnancy are considered private and are therefore not immediately visible in your [facility], we  may still be able to send a generic MyChart message for these results.  If you have cultures pending for sexually transmitted diseases, you will be contacted by phone.     Positive cultures are reported to the [place] Department of Health, as required by state law. These results are considered  private on My[facility] and can only be released by the provider.We will not contact you if the results are negative.    It is very important that we have a phone number that is the single best way to contact you in the event that we become aware of important clinical information or concerns after your discharge.  If the phone number you provided at registration is NOT this number you should inform staff and registration prior to leaving.     Please call [facility] at [phone] with any further questions.    Instructions discussed with patient upon discharge by clinical staff with all questions answered.    Barbaraann Barthel, APRN,WHNP-BC 12/13/2018, 11:42        Preventing Vaginal Infection  These steps can help you stay comfortable during treatment of a vaginal infection. They also help prevent vaginal infections in the future.  Keeping a healthy balance  Factors that change the normal balance in the vagina can lead to a vaginal infection. To help keep the balance normal, try these tips:   Change out of wet bathing suits and damp exercise clothes as soon as possible. Yeast thrive in a warm, moist environment.   Avoid wearing tight pants. Choose cotton underwear and pantyhose that have a cotton crotch. Cotton keeps you cooler and drier than synthetics.   Don't douche unless directed by your healthcare provider. Douching can destroy friendly bacteria and change the vagina's normal balance.   Wipe from front to back after using the toilet. This prevents bacteria from spreading from the anus to the vulva.   Wash the vulva with mild, unscented soap or with plain water.   Wash your diaphragm, spermicide applicators, and  sex toys with mild soap and water after use. Dry them thoroughly before putting them away.   Change tampons often (every 2 hoursto 4 hours). Leaving a tampon in for too long may disrupt the balance of vaginal bacteria.   Avoid vaginal sprays, scented toilet paper and soaps, and deodorant tampons or pads, which can cause vaginal irritation  Staying healthy overall  Good overall health can help you resist infection. To be healthier:   Help protect yourself from STIs (sexually transmitted infections) by using latex condoms for intercourse. Ask your healthcare provider for more information about safer sex.   Eat a variety of healthy foods.   Exercise regularly.   Get enough rest and sleep.   Maintain a healthy weight. If you need to lose weight, ask your healthcare provider for advice on how to start.  StayWell last reviewed this educational content on 05/23/2016   2000-2019 [employer] [place]. [address], [place] [address]. All rights reserved. This information is not intended as a substitute for professional medical care. Always follow your healthcare professional's instructions.        Vaginal Infection: Understanding the Vaginal Environment  The vagina is a canal. It connects the uterus (womb) to the outside of the body.It is home to many types of bacteria and other tiny organisms. These different bacteria most often stay balanced in number. This keeps the vagina healthy. If the balance changes, it can cause infection.  A healthy environment  Many types of bacteria are present in a healthy vagina. When balanced, they don't cause problems. Small amounts of yeast may also be present without causing problems. The most common type of bacteria in the vagina is lactobacillus. It helps keep the vagina at a low pH. A low pH keeps  bad bacteria from taking over.  Normal vaginal discharge  The vagina makes fluid. It is sent out as discharge. This also keeps the vagina healthy. Normal discharge can  be clear, white, or yellowish. Most women find that normal discharge varies in amount and color through the month.  An unhealthy environment  The vaginal environment may get out of balance. This may result in a vaginal infection. There are a few reasons this can happen. The pH may have changed. The amount of one organism, such as yeast, may increase. Or an outside organism may get into the vagina and throw off the balance:   Bacterial vaginosis (BV). BV is due to an imbalance in the normal bacteria in the vagina. Lactobacillus bacteria decrease. As a result, the numbers of bad bacteria increase.   Candidiasis (yeast infection). Yeast is a type of fungus. A yeast infection occurs when yeast cells in the vagina increase. They then attack vaginal tissues. A type of yeast called Candida albicans is often involved.   Trichomoniasis ("trich"). Dalbert Batman is a parasite. It is passed from one person to another during sex. Men with trich often don't have any symptoms. In women, it can take weeks or months before symptoms appear.  StayWell last reviewed this educational content on 11/21/2015   2000-2019 [employer] [place]. [address] [place] [address]. All rights reserved. This information is not intended as a substitute for professional medical care. Always follow your healthcare professional's instructions.      [place]     Operated by [facility]  [place], [place] [address]  Phone: [phone]  Fax: [phone]  [email or web address]  Twitter [email or web address]   Closed all  Holidays        Attending Caregiver: Barbaraann Barthel, APRN,WHNP-BC    Today's orders:   Orders Placed This Encounter   . CHLAMYDIA TRACHOMITIS DNA BY PCR (INHOUSE)   . NEISSERIA GONORRHOEAE DNA BY PCR   . POCT VAGINAL KOH & SALINE ([place] ONLY)   . terconazole (TERAZOL 7) 0.4 % Vaginal Cream         Prescription(s) E[facility]Rx to:  [id number] [place], [place]    Future appts with Student Health  Visit date not found    [place]. THIS IS ONE EASY WAY TO COMMUNICATE WITH [facility].  SEE THE CODE ON THIS FORM FOR ACCESS.    -----------------------------PRIVACY INFORMATION-----------------------------------------------  As a [employer] student, regardless of your age, we cannot discuss your personal health information with a parent, spouse, family member or anyone else without your expressed consent.    This policy does not include:  Individuals who would have a legitimate reason to access your records and information to assist in your care under the provisions of HIPAA ([place] and [place]) law;   Individuals with whom you have previously given expressed written consent to do so, such a legal guardian or Power of Attorney.    No one can access your My[facility], unless you give them your account sign on and password. You will receive emails from [place].  This means anyone who has access to the email account you provided can see this notification. There will be no private medical information included in these emails. This notification of  new medical information  available in your [facility], may be information that you do not want others to know.   _______________________________________________________________________    If your  symptoms persist, worsen or you develop any new or concerning symptoms please call [facility] for at [phone] for a follow up appointment.   If your symptoms are severe, go immediately to the emergency department or call 911.  Please check with your health insurance coverage for these types of visits.   Please keep all follow up visit recommended at today's visit.    If you received x-rays during your visit, be aware that the final and formal interpretation of those films by a radiologist will occur after your discharge.  If there is a significant  discrepancy identified after your discharge, we will contact you at the telephone number provided during registration.  These results are available for your review on [facility].    Please refer to your [facility] for lab test results. Lab test results related to HIV, STI's and pregnancy are considered private and are therefore not immediately visible in your [facility], we may still be able to send a generic MyChart message for these results.  If you have cultures pending for sexually transmitted diseases, you will be contacted by phone.     Positive cultures are reported to the [place] Department of Health, as required by state law. These results are considered private on My[facility] and can only be released by the provider.We will not contact you if the results are negative.    It is very important that we have a phone number that is the single best way to contact you in the event that we become aware of important clinical information or concerns after your discharge.  If the phone number you provided at registration is NOT this number you should inform staff and registration prior to leaving.     Please call [place] at [phone] with any further questions.    Instructions discussed with patient upon discharge by clinical staff with all questions answered.    Barbaraann Barthel, APRN,WHNP-BC 12/13/2018, 11:56

## 2018-12-13 NOTE — Nursing Note (Signed)
Barbara Graves presents to [facility], [facility] for the placement of a PPD.  See immunization record for placement details.     Patient questions:  Has the patient ever had a tuberculosis screening? Yes   Has the patient ever been exposed to tuberculosis? No   Has the patient ever received the BCG vaccine? No   Is the patient currently taking immune-suppressants? No   Has the patient received a live vaccine within the last 6 weeks? No   Has the patient ever had a positive PPD? No   Is the patient having unintentional weight loss? No   Does the patient have a fever, night sweats, and/or cough? No   Does the patient need a 2-step PPD?  If this is 1st step of a 2-step PPD, HOLD LIVE VACCINES until 2nd step. No       PPD was placed on left forearm.    No LMP recorded. (Menstrual status: Continuous Cycle OCP).    The patient was instructed to return in 48-72 hours for the reading of the PPD.    Lorri Frederick, [place] 12/13/2018, 11:21

## 2018-12-15 NOTE — Nursing Note (Signed)
Lakesia Dahle returns to the [facility], [facility] for reading of the PPD.   PPD reading was negative with 0 mm induration, left forearm.   Documentation of PPD results given to patient.   Santiago Bumpers, RN 12/15/2018, 11:37

## 2018-12-16 LAB — NEISSERIA GONORRHOEAE DNA BY PCR: NEISSERIA GONORRHOEAE PCR: NOT DETECTED

## 2018-12-16 LAB — CHLAMYDIA TRACHOMITIS DNA BY PCR (INHOUSE): CHLAMYDIA TRACHOMATIS PCR: NOT DETECTED

## 2019-01-10 ENCOUNTER — Other Ambulatory Visit: Payer: Self-pay

## 2019-01-10 ENCOUNTER — Ambulatory Visit (INDEPENDENT_AMBULATORY_CARE_PROVIDER_SITE_OTHER): Payer: BC Managed Care – PPO | Admitting: Emergency Medicine

## 2019-01-10 ENCOUNTER — Telehealth (HOSPITAL_BASED_OUTPATIENT_CLINIC_OR_DEPARTMENT_OTHER): Payer: Self-pay | Admitting: NURSE PRACTITIONER, FAMILY

## 2019-01-10 NOTE — Telephone Encounter (Signed)
Please schedule patient for a telemedicine visit.

## 2019-01-12 ENCOUNTER — Other Ambulatory Visit (HOSPITAL_BASED_OUTPATIENT_CLINIC_OR_DEPARTMENT_OTHER): Payer: Self-pay | Admitting: Obstetrics & Gynecology

## 2019-01-12 MED ORDER — NORGESTIMATE 0.25 MG-ETHINYL ESTRADIOL 0.035 MG TABLET
1.0000 | ORAL_TABLET | Freq: Every day | ORAL | 11 refills | Status: DC
Start: 2019-01-12 — End: 2019-01-27

## 2019-01-13 ENCOUNTER — Encounter (HOSPITAL_BASED_OUTPATIENT_CLINIC_OR_DEPARTMENT_OTHER): Payer: Self-pay | Admitting: Obstetrics & Gynecology

## 2019-01-20 ENCOUNTER — Encounter (FREE_STANDING_LABORATORY_FACILITY): Payer: BC Managed Care – PPO

## 2019-01-20 ENCOUNTER — Other Ambulatory Visit: Payer: Self-pay

## 2019-01-20 ENCOUNTER — Encounter (INDEPENDENT_AMBULATORY_CARE_PROVIDER_SITE_OTHER): Payer: Self-pay

## 2019-01-20 ENCOUNTER — Ambulatory Visit (INDEPENDENT_AMBULATORY_CARE_PROVIDER_SITE_OTHER): Payer: BC Managed Care – PPO

## 2019-01-20 ENCOUNTER — Encounter (FREE_STANDING_LABORATORY_FACILITY)
Admit: 2019-01-20 | Discharge: 2019-01-20 | Disposition: A | Discharge status: Home / Self Care | Payer: BC Managed Care – PPO

## 2019-01-20 VITALS — BP 128/87 | HR 108 | Temp 99.2°F | Resp 16

## 2019-01-20 DIAGNOSIS — Z113 Encounter for screening for infections with a predominantly sexual mode of transmission: Secondary | ICD-10-CM | POA: Insufficient documentation

## 2019-01-20 DIAGNOSIS — Z3202 Encounter for pregnancy test, result negative: Secondary | ICD-10-CM

## 2019-01-20 NOTE — Patient Instructions (Signed)
What Are Sexually Transmitted Infections (STIs)?  A sexually transmitted infection (STI) is an infection that is spread during sex. An STI can also be called STD for sexually transmitted disease. You can become infected with an STI if you have sex with someone who has an STI. Any sex that involves the penis, vagina, anus, or mouth can spread these infections. Some STIs also spread through body fluids such as semen, vaginal fluid, or blood. Others spread through contact with infected skin. The most common STIs are chlamydia, genital warts , genital herpes, syphilis, HIV, gonorrhea, and trichomoniasis.  Who is at risk?  It doesn't matter if you're straight or gay, female or female, young or old. Any person who has sex can get an STI. Your risk increases if:   You have more than one partner. The more partners you have, the greater your risk.   Your partner has other partners. If your partner is exposed to an STI, you could be, too.   You or your partner have had sex with other people in the past. Either of you might be carrying an STI from an earlier partner.   You have an STI. The STI may cause sores or other health problems that increase your risk for new infections. Your risk will stay high unless you are treated for your current STI and change the behaviors that put you at risk.  Prevent future problems  Left untreated, certain STIs can lead to cancer or, rarely, death. Some can harm unborn babies whose mothers are infected. Others can cause you to not be able to have children (sterility) or can affect changes in behavior or your ability to think. You can prevent these problems with safer sex, regular checkups, and early treatment. Always use a latex condom when you have sex. Get tested if you're at risk. And get treated early if you have an STI.     Places on or in the body where STDs cause damage include reproductive organs, the rectum, and the mouth.     Getting checked  The only sure way to know if you have  an STI is to get checked by a healthcare provider. If you notice a change in how your body looks or feels, have it checked out. But keep in mind, STIs don't always show symptoms. So if you're at risk for STIs, get checked regularly. If you find you have an STI, have your partner get treatment. If not, his or her health is at risk. And left untreated, your partner could pass the STI back to you, or on to others.  Common symptoms  Be alert to any changes in your body and your partner's body. Symptoms may appear in or near the vagina, penis, rectum, mouth, or throat. They may include:   Unusual discharge   Lumps, bumps, or rashes   Sores that may be painful, itchy, or painless   Itchy skin   Burning with urination   Pain in the pelvis, belly (abdomen), or rectum   Bleeding from the rectum  Even if you don't have symptoms  You may have an STI even if you don't have symptoms. If you think you are at risk, get checked. Go to a clinic or to your healthcare provider. If your partner has an STI, you need to be tested even if you feel fine.  Vaccines to prevent disease   Vaccines are available to prevent hepatitis A and hepatitis B. These are 2 kinds of STIs. There is  also a vaccine to prevent human papillomavirus (HPV). This is a virus that can be passed from person to person through sexual contact. Ask your healthcare provider whether any of these vaccines is right for you.  [employer] last reviewed this educational content on 08/22/2017   2000-2019 [employer]. [address] [place] [address]. All rights reserved. This information is not intended as a substitute for professional medical care. Always follow your healthcare professional's instructions.         [facility]      Operated by [facility]  [address] [place], [place] [address]  Phone: [phone])  Fax: [phone]  [email or web address]  Open Daily 8:00a - 8:00p    Closed  Thanksgiving and Christmas Day  [facility]     Operated by [facility]  [place], [place] [address]  Phone: [phone])  Fax: [phone]  [email or web address]  Open M-F  8:00a - 8:00p  Sat              10:00a - 4:00p  Sun             Closed  Closed all [place] Holidays        Attending Caregiver: Janetta Hora, APRN,NP-C      Today's orders:   Orders Placed This Encounter   . NEISSERIA GONORRHOEAE DNA BY PCR   . CHLAMYDIA TRACHOMITIS DNA BY PCR (INHOUSE)   . Urine Pregnancy        Prescription(s) E-Rx to:  [id number] [place], [place]    ________________________________________________________________________  Short Term Disability and [facility] does NOT provide assistance with any disability applications.  If you feel your medical condition requires you to be on disability, you will need to follow up with  your primary care physician or a specialist.  We apologize for any inconvenience.    For Medication Prescribed by [facility]:  As an Urgent Care facility, our clinic does NOT offer prescription refills over the telephone.    If you need more of the medication one of our medical providers prescribed, you will  either need to be re-evaluated by [place] or see your primary care physician.    ________________________________________________________________________      It is very important that we have a phone number.  This is the single best way to contact you in the event that we become aware of important clinical information or concerns after your discharge.  If the phone number you provided at registration is NOT this number you should inform staff and registration prior to leaving.      Your treatment and evaluation today was focused on identifying and treating potentially emergent conditions based on your presenting signs, symptoms, and history.  The resulting  initial clinical impression and treatment plan is not intended to be definitive or a substitute for a full physical examination and evaluation by your primary care provider.  If your symptoms persist, worsen, or you develop any new or concerning symptoms, you need to be evaluated.      If you received x-rays during your visit, be aware that the final and formal interpretation of those films by a radiologist may occur after your discharge.  If there is a significant discrepancy identified after your discharge, we will contact you at the telephone number provided at registration.  If you received a pelvic exam, you may have cultures pending for sexually transmitted diseases.  Positive cultures are reported to the [place] Department of Health as required by state law.  You may contact the Health Information Management Office of [facility] to get a copy of your results.     If you are over 66 year old, we cannot discuss your personal health information with a parent, spouse, family member, or anyone else without your consent.  This does not include those who have legitimate access to your records and information to assist in your care under the provisions of HIPAA ([place]) law, or those to whom you have previously given written consent to do so, such a legal guardian or Power of [place].      Instructions are discussed with patient upon discharge by clinical staff with all questions answered.  Please call [facility] [phone]) if any further questions develop.  Go immediately to the emergency department if any concerns or worsening symptoms.      Janetta Hora, APRN,NP-C 01/20/2019, 13:15

## 2019-01-20 NOTE — Progress Notes (Addendum)
Attending: Dr. Harlow Mares  History of Present Illness: Barbara Graves is a 23 y.o. female who presents to the Urgent Care today with chief complaint of    Chief Complaint            STI Screening       .     Pt presents to UC today with for a routine STD screening. Pt states she has had intermittent vaginal bleeding/spotting over the past 3-4 weeks. She believes that this may be due to a new medication she has been using (Nuvigil). Her Ob-Gyn decided to increase her birth control dosage in order to decrease the bleeding. It has significantly decreased the bleeding, as she has experienced no breakthrough bleeding during the past few weeks. However, Pt wants to make sure that these sxs are due to the medication, and not due to an STD. She requests STD screening during today's visit. 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 She takes suppressive medications as needed. Patient's last menstrual period was 01/13/2019. Pt was treated for chlamydia in 2019, as well as a yeast infection in March 2020. Pt tested negative for gonorrhea and chlamydia at [facility] in March 2020. Though her most recent sexual contact was protected, Pt sts that the condom broke during intercourse. She is experiencing some nausea, but believes that this may be due to the change in birth control. She has already scheduled a follow-up appointment with her Ob-Gyn to discuss birth control dosing (or a possible alternative). Pt endorses nausea. She denies fever, chills, discharge, dysuria, vomiting, vaginal irritation, abdominal pain, and flank pain.       Patient denies   new onset of vaginal discharge  Patient denies  exposure to STI  Known exposure to no known diseases.   Patient has history of  chlamydia greater than 2 months ago.  Last sexual contact 3Weeks ago with 1 partners.  Was this contact consensual? yes   Contact protected: yes   Sexual partners in last 60 days: 1  Lifetime sexual partners: 66  Gender of current/past  partners Female.  Contraception method: oral contraceptives    I reviewed and confirmed the patient's past medical history taken by the nurse or medical assistant with the addition of the following:    Past Medical History:    Past Medical History:   Diagnosis Date   . STD (sexually transmitted disease)     HVS2         Past Surgical History:    Past Surgical History:   Procedure Laterality Date   . HX NO SURGICAL PROCEDURES           Allergies:  No Known Allergies  Medications:    Current Outpatient Medications   Medication Sig   . armodafinil (NUVIGIL ORAL) Take by mouth   . L norgesterol-E estradiol/E estradiol (ASHLYNA) 0.15 mg-30 mcg/10 mcg Oral tablet Take 1 Tab by mouth Once a day (Patient not taking: Reported on 01/20/2019)   . Norgestimate-Ethinyl Estradiol (Sprintec, 28,) 0.25-35 mg-mcg Oral Tablet Take 1 Tab by mouth Once a day   . valACYclovir (VALTREX) 500 mg Oral Tablet Take 1 Tab (500 mg total) by mouth Twice daily   . valACYclovir (VALTREX) 500 mg Oral Tablet Take 1 Tab (500 mg total) by mouth Twice daily     Social History:    Social History     Tobacco Use   . Smoking status: Never Smoker   . Smokeless tobacco:  Never Used   Substance Use Topics   . Alcohol use: Never     Frequency: Never   . Drug use: Never     Family History: No significant family history.  Family Medical History:     Problem Relation (Age of Onset)    No Known Problems Mother, Father, Maternal Grandmother, Maternal Grandfather, Paternal Grandmother, Paternal Grandfather          Review of Systems:    General: no fever, no chills  Genitourinary:  vaginal bleeding/spotting, no discharge, no dysuria, no vaginal irritation, no flank pain  Gastrointestinal: nausea, no abdominal pain, no vomiting, no diarrhea  All other review of systems were negative    Physical Exam:  Vital signs:   Vitals:    01/20/19 1301   BP: 128/87   Pulse: (!) 108   Resp: 16   Temp: 37.3 C (99.2 F)   TempSrc: Thermal Scan   SpO2: 97%         There is no height  or weight on file to calculate BMI. Facility age limit for growth percentiles is 20 years.  Patient's last menstrual period was 01/13/2019.    General:  Well appearing and no acute distress  Head:  Normocephalic  Eyes:  Normal lids/lashes and normal conjunctiva  Neck:  Supple  Pulmonary:  Clear to auscultation bilaterally, no wheezes, no rales and no rhonchi  Cardiovascular:  Regular rate/rhythm, normal S1/S2 and no murmur/rub/gallop  Gastrointestinal:  Non-distended, soft, non-tender and no guarding  Genitourinary: no CVA tenderness  Skin:  Warm/dry and no rash  Psychiatric:  Appropriate affect and behavior  Neurologic:   Alert and oriented x 3    Data Reviewed:      Point-of-care testing:     Urine Pregnancy: Negative                   and Labs: Gonorrhea, Chlamydia    Course: Condition at discharge: Good     Differential Diagnosis: STD screening    Assessment:   1. Screening for STDs (sexually transmitted diseases)        Plan:    Orders Placed This Encounter   . NEISSERIA GONORRHOEAE DNA BY PCR   . CHLAMYDIA TRACHOMITIS DNA BY PCR (INHOUSE)   . Urine Pregnancy           Pt declined blood work for STD testing today.   Standard STD screening (Gonorrhea, Chlamydia) was ordered. Will contact patient with the results of orders placed. Discussed safe sexual intercourse measures with patient. Advised patient to refrain from sexual intercourse pending test results.  Urine pregnancy screening was conducted in-clinic and yielded negative results.   Follow up with Ob-Gyn.      Go to Emergency Department immediately for further work up if any concerning symptoms.  Plan was discussed and patient verbalized understanding.  If symptoms are worsening or not improving the patient should return to the Urgent Care for further evaluation.    I am scribing for, and in the presence of, Benita Gutter APRN for services provided on 01/20/2019.  Letta Kocher, SCRIBE       Letta Kocher, SCRIBE 01/20/2019, 13:43   I personally performed the  services described in this documentation, as scribed  in my presence, and it is both accurate  and complete.    Janetta Hora, APRN  The supervising physician was physically present in Urgent Care and available for consultation, and did not participate in the care of this patient.

## 2019-01-24 ENCOUNTER — Encounter (HOSPITAL_BASED_OUTPATIENT_CLINIC_OR_DEPARTMENT_OTHER): Payer: Self-pay | Admitting: Obstetrics & Gynecology

## 2019-01-24 ENCOUNTER — Telehealth: Payer: BC Managed Care – PPO | Admitting: Obstetrics & Gynecology

## 2019-01-24 DIAGNOSIS — Z309 Encounter for contraceptive management, unspecified: Secondary | ICD-10-CM

## 2019-01-24 LAB — NEISSERIA GONORRHOEAE DNA BY PCR: NEISSERIA GONORRHOEAE PCR: NOT DETECTED

## 2019-01-24 LAB — CHLAMYDIA TRACHOMITIS DNA BY PCR (INHOUSE): CHLAMYDIA TRACHOMATIS PCR: NOT DETECTED

## 2019-01-24 NOTE — Progress Notes (Signed)
TELEMEDICINE DOCUMENTATION:    Patient Location:  MyChart video visit from home address: [address]  [address] [address]    Patient/family aware of provider location:  yes  Patient/family consent for telemedicine:  yes  Examination observed and performed by:  Claretha Cooper, MD    HPI: 23yo to discuss her OCP's. She is currently on sprintec for the last 2 wks and thought that she should have no period after a week. She went on to a website and thought that if she increased her dosage it would stop her period. She is sexually active with one partner. Hx is positive for HSV. No other complaints at this time    Review of Systems   General/constitutional: good general state of health/well-being.  Cardiovascular: Negative.   Respiratory: Negative   Gastrointestinal: Negative  Genitourinary: Negative for dysuria, urgency, frequency, hematuria and flank pain.   Musculoskeletal: Negative.   Endo/Heme/Allergies: Negative.   Neurological: :Negative  Psychiatric: Negative.  Skin: Negative    A: 23yo for birth control consult    P: I advised the patient not to take more than one pill at a time as this will put her at risk for a cardiovascular event including clot formation with possible stroke. I also recommended that if she wants to continue these pills that she should skip the last weeks pills and go straight to the next pack or a better alternative would be mirena. She voiced understanding and will make an appt for IUD insertion.     Claretha Cooper, MD  01/24/2019, 13:39

## 2019-01-27 ENCOUNTER — Other Ambulatory Visit (HOSPITAL_BASED_OUTPATIENT_CLINIC_OR_DEPARTMENT_OTHER): Payer: Self-pay | Admitting: Obstetrics & Gynecology

## 2019-01-27 ENCOUNTER — Encounter (HOSPITAL_BASED_OUTPATIENT_CLINIC_OR_DEPARTMENT_OTHER): Payer: Self-pay | Admitting: Obstetrics & Gynecology

## 2019-01-27 DIAGNOSIS — Z309 Encounter for contraceptive management, unspecified: Secondary | ICD-10-CM

## 2019-01-27 MED ORDER — NORGESTIMATE 0.25 MG-ETHINYL ESTRADIOL 0.035 MG TABLET
1.0000 | ORAL_TABLET | Freq: Every day | ORAL | 0 refills | Status: DC
Start: 2019-01-27 — End: 2019-03-24

## 2019-01-31 ENCOUNTER — Ambulatory Visit (HOSPITAL_BASED_OUTPATIENT_CLINIC_OR_DEPARTMENT_OTHER): Payer: Self-pay | Admitting: Obstetrics & Gynecology

## 2019-02-15 ENCOUNTER — Ambulatory Visit (HOSPITAL_BASED_OUTPATIENT_CLINIC_OR_DEPARTMENT_OTHER): Payer: Self-pay | Admitting: Obstetrics & Gynecology

## 2019-02-15 NOTE — Telephone Encounter (Signed)
Pt had an appointment with Dr. Wille Celeste on 6/8 for IUD placement.  Pt has decided at this time to cancel that appointment and to continue taking sprintec.   Courtney Heys, RN  02/15/2019, 14:42

## 2019-02-15 NOTE — Telephone Encounter (Signed)
Regarding: [name]   ----- Message from Stoney Bang sent at 02/15/2019 10:19 AM EDT -----  Barbara Graves pt    Hello,    The pt sent an in basket message requesting for a nurse to call her about some questions regarding her June 8 IUD insertion.    Please advise pt.    Thank you,  Stoney Bang

## 2019-02-28 ENCOUNTER — Ambulatory Visit (HOSPITAL_BASED_OUTPATIENT_CLINIC_OR_DEPARTMENT_OTHER): Payer: Self-pay | Admitting: Obstetrics & Gynecology

## 2019-03-24 ENCOUNTER — Other Ambulatory Visit (HOSPITAL_BASED_OUTPATIENT_CLINIC_OR_DEPARTMENT_OTHER): Payer: Self-pay | Admitting: Obstetrics & Gynecology

## 2019-03-24 DIAGNOSIS — Z309 Encounter for contraceptive management, unspecified: Secondary | ICD-10-CM

## 2019-03-24 MED ORDER — NORGESTIMATE 0.25 MG-ETHINYL ESTRADIOL 0.035 MG TABLET
1.0000 | ORAL_TABLET | Freq: Every day | ORAL | 2 refills | Status: DC
Start: 2019-03-24 — End: 2019-09-02

## 2019-03-24 NOTE — Telephone Encounter (Signed)
Regarding: [name]  ----- Message from Lorelei Pont sent at 03/22/2019  3:44 PM EDT -----  Wille Celeste pt  Hello,    Pt states she is needing her prescription rewrote and sent to her pharmacy.    Norgestimate-Ethinyl Estradiol (Sprintec, 28,) 0.25-35 mg-mcg Oral Tablet    Preferred Pharmacy     [id number] [place] [place]    [address] [place] [address]    Phone: [phone] Fax: [phone]    Open 24 hours      Please advise pt.    Thank you,  Meghan

## 2019-04-02 ENCOUNTER — Encounter (FREE_STANDING_LABORATORY_FACILITY)
Admit: 2019-04-02 | Discharge: 2019-04-02 | Disposition: A | Discharge status: Home / Self Care | Payer: BC Managed Care – PPO | Attending: PHYSICIAN ASSISTANT | Admitting: PHYSICIAN ASSISTANT

## 2019-04-02 ENCOUNTER — Other Ambulatory Visit: Payer: Self-pay

## 2019-04-02 ENCOUNTER — Ambulatory Visit (INDEPENDENT_AMBULATORY_CARE_PROVIDER_SITE_OTHER): Payer: BC Managed Care – PPO

## 2019-04-02 ENCOUNTER — Encounter (INDEPENDENT_AMBULATORY_CARE_PROVIDER_SITE_OTHER): Payer: Self-pay

## 2019-04-02 ENCOUNTER — Encounter (FREE_STANDING_LABORATORY_FACILITY): Payer: BC Managed Care – PPO | Admitting: PHYSICIAN ASSISTANT

## 2019-04-02 VITALS — BP 127/83 | HR 87 | Temp 98.2°F | Resp 16 | Ht 62.0 in | Wt 103.6 lb

## 2019-04-02 DIAGNOSIS — N760 Acute vaginitis: Secondary | ICD-10-CM

## 2019-04-02 DIAGNOSIS — N898 Other specified noninflammatory disorders of vagina: Secondary | ICD-10-CM | POA: Insufficient documentation

## 2019-04-02 DIAGNOSIS — Z113 Encounter for screening for infections with a predominantly sexual mode of transmission: Secondary | ICD-10-CM | POA: Insufficient documentation

## 2019-04-02 DIAGNOSIS — B9689 Other specified bacterial agents as the cause of diseases classified elsewhere: Secondary | ICD-10-CM

## 2019-04-02 DIAGNOSIS — Z3202 Encounter for pregnancy test, result negative: Secondary | ICD-10-CM

## 2019-04-02 MED ORDER — METRONIDAZOLE 500 MG TABLET
500.00 mg | ORAL_TABLET | Freq: Two times a day (BID) | ORAL | 0 refills | Status: AC
Start: 2019-04-02 — End: 2019-04-09

## 2019-04-02 NOTE — Progress Notes (Addendum)
Attending: Dr. Stann Mainland  History of Present Illness: Barbara Graves is a 23 y.o. female who presents to the Urgent Care today with chief complaint of    Chief Complaint            Vaginal Odor       .     Pt presents to UC today with c/o vaginal odor onset a few days ago. Pt notes 1 new sexual partner within the last 60 days. Her most recent menstrual period occurred 3-4 months ago due to her birth control. She has a known Hx of an STD (unspecified). She denies fever, chills, nausea, vomiting, diarrhea, vaginal discharge, abdominal pain, vaginal pruritus, and dysuria.       Patient denies   new onset of dysuria  Known exposure to no known diseases.   Sexual partners in last 60 days: 1    I reviewed and confirmed the patient's past medical history taken by the nurse or medical assistant with the addition of the following:    Past Medical History:    Past Medical History:   Diagnosis Date   . STD (sexually transmitted disease)     HVS2         Past Surgical History:    Past Surgical History:   Procedure Laterality Date   . HX NO SURGICAL PROCEDURES           Allergies:  No Known Allergies  Medications:    Current Outpatient Medications   Medication Sig   . armodafinil (NUVIGIL ORAL) Take by mouth   . L norgesterol-E estradiol/E estradiol (ASHLYNA) 0.15 mg-30 mcg/10 mcg Oral tablet Take 1 Tab by mouth Once a day (Patient not taking: Reported on 01/20/2019)   . metroNIDAZOLE (FLAGYL) 500 mg Oral Tablet Take 1 Tab (500 mg total) by mouth Twice daily for 7 days Flagyl 500mg  two times daily times 7 days   . Norgestimate-Ethinyl Estradiol (Sprintec, 28,) 0.25-35 mg-mcg Oral Tablet Take 1 Tab by mouth Once a day   . valACYclovir (VALTREX) 500 mg Oral Tablet Take 1 Tab (500 mg total) by mouth Twice daily   . valACYclovir (VALTREX) 500 mg Oral Tablet Take 1 Tab (500 mg total) by mouth Twice daily     Social History:    Social History     Tobacco Use   . Smoking status: Never Smoker   . Smokeless tobacco: Never Used   .  Tobacco comment: JUULs   Substance Use Topics   . Alcohol use: Yes     Frequency: Monthly or less   . Drug use: Never     Family History: No significant family history.  Family Medical History:     Problem Relation (Age of Onset)    No Known Problems Mother, Father, Maternal Grandmother, Maternal Grandfather, Paternal Grandmother, Paternal Grandfather          Review of Systems:    General: no fever, no chills  Genitourinary:  vaginal odor, no vaginal discharge, no vaginal pruritus, no dysuria  Gastrointestinal: no abdominal pain, no nausea, no vomiting, no diarrhea  All other review of systems were negative    Physical Exam:  Vital signs:   Vitals:    04/02/19 1228   BP: 127/83   Pulse: 87   Resp: 16   Temp: 36.8 C (98.2 F)   TempSrc: Thermal Scan   SpO2: 99%   Weight: 47 kg (103 lb 9.9 oz)   Height: 1.575 m (5\' 2" )   BMI: 18.99  Body mass index is 18.95 kg/m. Facility age limit for growth percentiles is 20 years.  No LMP recorded. (Menstrual status: Continuous Cycle OCP).    General:  Well appearing and no acute distress  Head:  Normocephalic  Eyes:  Normal lids/lashes and normal conjunctiva  ENT:  Normal EAC's, normal TM's, MMM, normal pharynx/tonsils and normal tongue/uvula  Neck:  Supple  Pulmonary:  Clear to auscultation bilaterally, no wheezes, no rales and no rhonchi  Cardiovascular:  Regular rate/rhythm, normal S1/S2 and no murmur/rub/gallop  Gastrointestinal:  Non-distended, soft, non-tender and no guarding  Skin:  Warm/dry and no rash  Psychiatric:  Appropriate affect and behavior  Neurologic:   Alert and oriented x 3  Hem/Lymph:  No cervical lymphadenopathy       Data Reviewed:      Point-of-care testing:     Urine Pregnancy: Negative                   and Labs: Gonorrhea, Chlamydia, Syphilis, HIV 1/2, and Hep C    Course: Condition at discharge: Good     Differential Diagnosis: STD screen vs BV     Assessment:   1. Vaginal odor    2. Screen for STD (sexually transmitted disease)    3. BV  (bacterial vaginosis)        Plan:    Orders Placed This Encounter   . NEISSERIA GONORRHOEAE DNA BY PCR   . CHLAMYDIA TRACHOMITIS DNA BY PCR (INHOUSE)   . Urine Pregnancy   . Bacterial Vaginosis (BV)   . UC POCT TRICHOMAS VAGINALAS (AMB ONLY)   . metroNIDAZOLE (FLAGYL) 500 mg Oral Tablet        Standard STI screening (Gonorrhea, Chlamydia, Syphilis, HIV 1/2, and Hep C) was ordered. Will contact patient with the results of orders placed. Discussed safe sexual intercourse measures with patient. Advised patient to refrain from sexual intercourse pending test results.  Urine pregnancy (negative results), BV (positive results), and Trichomas Vaginalis (negative results) screenings were conducted in-clinic.   Use Flagyl as directed. Symptomatic management with OTC and home remedies.  Return for follow-up if sxs persist or worsen.    Go to Emergency Department immediately for further work up if any concerning symptoms.  Plan was discussed and patient verbalized understanding.  If symptoms are worsening or not improving the patient should return to the Urgent Care for further evaluation.    I am scribing for, and in the presence of, Trinda Pascal PA-C for services provided on 04/02/2019.  Letta Kocher, SCRIBE       Letta Kocher, SCRIBE 04/02/2019, 13:25     I personally performed the services described in this documentation, as scribed in my presence, and it is both accurate and complete.     The co-signing faculty was physically present and available for consultation and did not physically see this patient.  Trinda Pascal, PA-C  04/02/2019, 16:27

## 2019-04-05 LAB — CHLAMYDIA TRACHOMITIS DNA BY PCR (INHOUSE): CHLAMYDIA TRACHOMATIS PCR: DETECTED — AB

## 2019-04-05 LAB — NEISSERIA GONORRHOEAE DNA BY PCR: NEISSERIA GONORRHOEAE PCR: NOT DETECTED

## 2019-04-06 ENCOUNTER — Other Ambulatory Visit (INDEPENDENT_AMBULATORY_CARE_PROVIDER_SITE_OTHER): Payer: Self-pay | Admitting: NURSE PRACTITIONER

## 2019-04-06 DIAGNOSIS — A749 Chlamydial infection, unspecified: Secondary | ICD-10-CM

## 2019-04-06 MED ORDER — AZITHROMYCIN 500 MG TABLET
1000.0000 mg | ORAL_TABLET | Freq: Every day | ORAL | 0 refills | Status: DC
Start: 2019-04-06 — End: 2019-09-02

## 2019-09-02 ENCOUNTER — Ambulatory Visit (INDEPENDENT_AMBULATORY_CARE_PROVIDER_SITE_OTHER): Payer: BC Managed Care – PPO

## 2019-09-02 ENCOUNTER — Encounter (FREE_STANDING_LABORATORY_FACILITY): Payer: BC Managed Care – PPO | Admitting: Medical

## 2019-09-02 ENCOUNTER — Encounter (INDEPENDENT_AMBULATORY_CARE_PROVIDER_SITE_OTHER): Payer: Self-pay

## 2019-09-02 ENCOUNTER — Other Ambulatory Visit: Payer: Self-pay

## 2019-09-02 ENCOUNTER — Encounter (FREE_STANDING_LABORATORY_FACILITY)
Admit: 2019-09-02 | Discharge: 2019-09-02 | Disposition: A | Discharge status: Home / Self Care | Payer: BC Managed Care – PPO | Attending: Medical | Admitting: Medical

## 2019-09-02 VITALS — BP 120/77 | HR 80 | Temp 98.4°F | Resp 18 | Ht 62.0 in | Wt 111.1 lb

## 2019-09-02 DIAGNOSIS — N898 Other specified noninflammatory disorders of vagina: Secondary | ICD-10-CM | POA: Insufficient documentation

## 2019-09-02 DIAGNOSIS — F17210 Nicotine dependence, cigarettes, uncomplicated: Secondary | ICD-10-CM

## 2019-09-02 DIAGNOSIS — A749 Chlamydial infection, unspecified: Secondary | ICD-10-CM

## 2019-09-02 MED ORDER — AZITHROMYCIN 500 MG TABLET
1000.0000 mg | ORAL_TABLET | Freq: Every day | ORAL | 0 refills | Status: DC
Start: 2019-09-02 — End: 2019-10-14

## 2019-09-02 NOTE — Progress Notes (Addendum)
Attending physician: Dr. Donzetta Starch  History of Present Illness: Barbara Graves is a 23 y.o. female who [facility] today with chief complaint of    Chief Complaint            STD Screening slight vaginal irritation, wants general screening        Pt [facility] with c/o STI screening. She reports that in the last couple of months she had her OCP adjusted and believes that it could have caused her change in vaginal discharge. Pt sts that she has had 1 new sexual partner. She reports that she has a Hx of chlamydia in the past and her sxs feel slightly similar. She associates increase in vaginal discharge and vaginal irritation. Pt denies flank pain, vaginal odor, dysuria, fever, chills, lower back pain, and nausea.    Functional Health Screening:   Patient is under 18: No  Have you had a recent unexplained weight loss or gain?: No  Because we are aware of abuse and domestic violence today, we ask all patients: Are you being hurt, hit, or frightened by anyone at your home or in your life?: No  Do you have any basic needs within your home that are not being met? (such as Food, Shelter, Games developer, Transportation): No  Patient is under 18 and therefore has no Advance Directives: No  Patient has: No Advance  Patient has Advance Directive: No  Patient offered: Refused Packet  Screening unable to be completed: No       I reviewed and confirmed the patient's past medical history taken by the nurse or medical assistant with the addition of the following:    Past Medical History:    Past Medical History:   Diagnosis Date   . STD (sexually transmitted disease)     HVS2     Past Surgical History:    Past Surgical History:   Procedure Laterality Date   . HX NO SURGICAL PROCEDURES       Allergies:  No Known Allergies     Medications:    Current Outpatient Medications   Medication Sig   . armodafinil (NUVIGIL ORAL) Take by mouth   . azithromycin (ZITHROMAX) 500 mg Oral  Tablet Take 2 Tabs (1,000 mg total) by mouth Once a day   . L norgesterol-E estradiol/E estradiol (ASHLYNA) 0.15 mg-30 mcg/10 mcg Oral tablet Take 1 Tab by mouth Once a day   . Methylphenidate (RITALIN) 20 mg Oral Tablet Take 20 mg by mouth Once per day as needed Immediate release - PRN   . Methylphenidate (RITALIN) 20 mg Oral Tablet Take 30 mg by mouth Once a day Extended release every morning   . valACYclovir (VALTREX) 500 mg Oral Tablet Take 1 Tab (500 mg total) by mouth Twice daily   . valACYclovir (VALTREX) 500 mg Oral Tablet Take 1 Tab (500 mg total) by mouth Twice daily     Social History:    Social History     Tobacco Use   . Smoking status: Current Every Day Smoker   . Smokeless tobacco: Never Used   . Tobacco comment: JUULs   Substance Use Topics   . Alcohol use: Yes     Frequency: Monthly or less   . Drug use: Never     Family History: No significant family history.  Family Medical History:     Problem Relation (Age of Onset)    No Known Problems Mother, Father, Maternal Grandmother,  Maternal Grandfather, Paternal Grandmother, Paternal Grandfather        Review of Systems:    General: No fever and no chills  Gastrointestinal: No nausea  Genitourinary: Increase in vaginal discharge, vaginal irritation, no flank pain, no vaginal odor, no dysuria, and no lower back pain  All other review of systems were negative    Physical Exam:  Vital signs:   Vitals:    09/02/19 1230   BP: 120/77   Pulse: 80   Resp: 18   Temp: 36.9 C (98.4 F)   TempSrc: Thermal Scan   SpO2: 99%   Weight: 50.4 kg (111 lb 1.8 oz)   Height: 1.575 m (_0 )   BMI: 20.37     Body mass index is 20.32 kg/m. Facility age limit for growth percentiles is 20 years.  No LMP recorded. (Menstrual status: Continuous Cycle OCP).    General:  Well appearing and No acute distress  Eyes:  Normal lids/lashes, normal conjunctiva  ENT:  normal EAC's, normal TM's, MMM, normal pharynx/tonsils, normal tongue/uvula  Pulmonary:  no wheezes,  Gastrointestinal:   Non-distended, Skin:  warm/dry and no rash  Psychiatric:  Appropriate affect and behavior and Normal speech  Neurologic:   Alert and oriented x 3    Data Reviewed:    Point-of-care testing:     Urine Pregnancy: Negative                     Labs: STI screening for chlamydia and gonorrhea    Course: Condition at discharge: Good     Differential Diagnosis: chlamydia vs BV vs vaginal discharge    Assessment:   1. Vaginal discharge    2. Chlamydia      Plan:    Orders Placed This Encounter   . CHLAMYDIA AND NEISSERIA DNA BY PCR   . azithromycin (ZITHROMAX) 500 mg Oral Tablet     Pending STI screening for chlamydia and gonorrhea, will notify with results.  Patient declined blood STI screening at this time.  Take azithromycin as prescribed.  Advised patient to avoid sexual contact for 1 week after treatment.  Educated on safe sex practices and condom usage.    Go to Emergency Department immediately for further work up if any concerning symptoms.  Plan was discussed and patient verbalized understanding. If symptoms are worsening or not improving the patient should return to the [facility] for further evaluation.    I am scribing for, and in the presence of, Ulyess Blossom, PA-C, for services provided on 09/02/2019.  Celene Squibb, SCRIBE   [place], [place] 09/02/2019, 12:41  I personally performed the services described in this documentation, as scribed  in my presence, and it is both accurate  and complete.    Patient was seen independently in clinic. The cosigning physician was available but did not participate in an examination or management of this patient unless otherwise noted.   Ulyess Blossom, PA-C    I was physically present in the urgent care and available for consultation but did not participate in direct care of this patient.    Donzetta Starch, DO 09/06/2019, 09:32  [facility], [place]  Operated by [facility]  [address] [place] [address]  Dept:  [phone]

## 2019-09-05 LAB — CHLAMYDIA AND NEISSERIA DNA BY PCR
CHLAMYDIA TRACHOMATIS PCR: DETECTED — AB
NEISSERIA GONORRHOEAE PCR: NOT DETECTED

## 2019-10-01 ENCOUNTER — Other Ambulatory Visit: Payer: Self-pay

## 2019-10-01 ENCOUNTER — Encounter (FREE_STANDING_LABORATORY_FACILITY)
Admit: 2019-10-01 | Discharge: 2019-10-01 | Disposition: A | Discharge status: Home / Self Care | Payer: BC Managed Care – PPO | Attending: Obstetrics & Gynecology | Admitting: Obstetrics & Gynecology

## 2019-10-01 ENCOUNTER — Ambulatory Visit (INDEPENDENT_AMBULATORY_CARE_PROVIDER_SITE_OTHER): Payer: BC Managed Care – PPO

## 2019-10-01 ENCOUNTER — Encounter (FREE_STANDING_LABORATORY_FACILITY): Payer: BC Managed Care – PPO | Admitting: Obstetrics & Gynecology

## 2019-10-01 ENCOUNTER — Encounter (INDEPENDENT_AMBULATORY_CARE_PROVIDER_SITE_OTHER): Payer: Self-pay

## 2019-10-01 VITALS — BP 109/69 | HR 83 | Temp 97.3°F | Resp 16 | Ht 62.0 in | Wt 110.0 lb

## 2019-10-01 DIAGNOSIS — N920 Excessive and frequent menstruation with regular cycle: Secondary | ICD-10-CM | POA: Insufficient documentation

## 2019-10-01 DIAGNOSIS — N9489 Other specified conditions associated with female genital organs and menstrual cycle: Secondary | ICD-10-CM

## 2019-10-01 DIAGNOSIS — Z8619 Personal history of other infectious and parasitic diseases: Secondary | ICD-10-CM | POA: Insufficient documentation

## 2019-10-01 DIAGNOSIS — Z3202 Encounter for pregnancy test, result negative: Secondary | ICD-10-CM

## 2019-10-01 DIAGNOSIS — N949 Unspecified condition associated with female genital organs and menstrual cycle: Secondary | ICD-10-CM

## 2019-10-01 NOTE — Progress Notes (Signed)
Attending physician: Dr. Ollen Gross, MD  History of Present Illness: Barbara Graves is a 24 y.o. female who presents to the Urgent Care today with chief complaint of    Chief Complaint            STI Screening cramping and spotting        Pt presents to Urgent Care with c/o abdominal cramping onset "about a month" ago. Pt was last seen in clinic on 09/02/2019 and tested positive for chlamydia which was treated with Zithromax. Pt sts that her sxs a month ago were abdominal cramping, spotting, and abnormal vaginal discharge. Pt reports that after treatment she is still experiencing abdominal cramping and spotting but her abnormal vaginal discharge has resolved. Pt sts she took the full course of medication and sts she abstained from sex 1 week after treatment. Pt reports that her partner was treated as well. Pt sts she is on [facility] and denies chance of pregnancy. Pt associates spotting. Pt denies fever, chills, dysuria, and abnormal vaginal discharge.     I reviewed and confirmed the patient's past medical history taken by the nurse or medical assistant with the addition of the following:    Past Medical History:    Past Medical History:   Diagnosis Date   . STD (sexually transmitted disease)     HVS2     Past Surgical History:    Past Surgical History:   Procedure Laterality Date   . HX NO SURGICAL PROCEDURES       Allergies:  No Known Allergies  Medications:    Current Outpatient Medications   Medication Sig   . armodafinil (NUVIGIL ORAL) Take by mouth   . azithromycin (ZITHROMAX) 500 mg Oral Tablet Take 2 Tabs (1,000 mg total) by mouth Once a day   . L norgesterol-E estradiol/E estradiol (ASHLYNA) 0.15 mg-30 mcg/10 mcg Oral tablet Take 1 Tab by mouth Once a day   . Methylphenidate (RITALIN) 20 mg Oral Tablet Take 20 mg by mouth Once per day as needed Immediate release - PRN   . Methylphenidate (RITALIN) 20 mg Oral Tablet Take 30 mg by mouth Once a day Extended release every morning   . valACYclovir (VALTREX) 500  mg Oral Tablet Take 1 Tab (500 mg total) by mouth Twice daily   . valACYclovir (VALTREX) 500 mg Oral Tablet Take 1 Tab (500 mg total) by mouth Twice daily     Social History:    Social History     Tobacco Use   . Smoking status: Current Every Day Smoker   . Smokeless tobacco: Never Used   . Tobacco comment: JUULs   Substance Use Topics   . Alcohol use: Yes     Frequency: Monthly or less   . Drug use: Never     Family History: No significant family history.  Family Medical History:     Problem Relation (Age of Onset)    No Known Problems Mother, Father, Maternal Grandmother, Maternal Grandfather, Paternal Grandmother, Paternal Grandfather        Review of Systems:  General: no fever, no chills  Genitourinary: abdominal cramping, spotting, no dysuria, no abnormal vaginal discharge  All other review of systems are negative    Physical Exam:  Vital signs:   Vitals:    10/01/19 1136   BP: 109/69   Pulse: 83   Resp: 16   Temp: 36.3 C (97.3 F)   TempSrc: Thermal Scan   SpO2: 98%   Weight: 49.9 kg (  110 lb 0.2 oz)   Height: 1.575 m (5\' 2" )   BMI: 20.16     Body mass index is 20.12 kg/m. Facility age limit for growth percentiles is 20 years.  Patient's last menstrual period was 08/12/2019.    General: Well appearing and no acute distress  Head: Normocephalic and atraumatic  Eyes: Normal lids/lashes, normal conjunctiva  Genito-urinary: Small amount of dark red blood originating from the cervix, no other discharge, no CMT   Skin: Warm/dry and no rash  Psychiatric:Appropriate affect and behavior and normal speech  Neurologic: Alert and oriented x 3    Data Reviewed:  Point-of-care testing:     Urine Pregnancy: Negative  Rapid Trich: Negative  Bacterial Vag.: Negative                   Labs: Pending screening for gonorrhea and chlamydia    Course: Condition at discharge: Good     Differential Diagnosis: STI     Assessment:   1. Vaginal cramping    2. History of chlamydia    3. Spotting      Plan:    Orders Placed This  Encounter   . [place] FEMALE PELVIS   . NEISSERIA GONORRHOEAE DNA BY PCR   . Chlamydia Trachomitis by PCR   . Urine Pregnancy   . Bacterial Vaginosis (BV)   . POCT TRICHOMAS VAGINALAS     Urine pregnancy, rapid BV, and rapid trich performed in clinic; negative results.   Pending screening for gonorrhea and chlamydia; will follow up with results. No intercourse until results returned.   Order was placed for pelvic US to evaluate vaginal bleeding.   Return to clinic if sxs worsen or go to [facility].    Go to Emergency Department immediately for further work up if any concerning symptoms.  Plan was discussed and patient verbalized understanding.  If symptoms are worsening or not improving the patient should return to the Urgent Care for further evaluation.    I am scribing for, and in the presence of, Luanna Salk, APRN, for services provided on 10/01/2019.  Celso Sickle, [place], [place] 10/01/2019, 11:46    The co-signing faculty was physically present and available for consultation and did not participate in the care of this patient.    I personally performed the services described in this documentation, as scribed  in my presence, and it is both accurate  and complete.    Luanna Salk, APRN,NP-C  Luanna Salk, APRN,NP-C  10/01/2019, 17:43

## 2019-10-03 LAB — NEISSERIA GONORRHOEAE DNA BY PCR: NEISSERIA GONORRHOEAE PCR: NOT DETECTED

## 2019-10-03 LAB — CHLAMYDIA TRACHOMITIS DNA BY PCR (INHOUSE): CHLAMYDIA TRACHOMATIS PCR: NOT DETECTED

## 2019-10-12 ENCOUNTER — Ambulatory Visit
Admission: RE | Admit: 2019-10-12 | Discharge: 2019-10-12 | Disposition: A | Discharge status: Home / Self Care | Payer: BC Managed Care – PPO | Source: Ambulatory Visit | Attending: Obstetrics & Gynecology | Admitting: Obstetrics & Gynecology

## 2019-10-12 ENCOUNTER — Other Ambulatory Visit: Payer: Self-pay

## 2019-10-12 DIAGNOSIS — Z8619 Personal history of other infectious and parasitic diseases: Secondary | ICD-10-CM | POA: Insufficient documentation

## 2019-10-12 DIAGNOSIS — N9489 Other specified conditions associated with female genital organs and menstrual cycle: Secondary | ICD-10-CM

## 2019-10-12 DIAGNOSIS — N920 Excessive and frequent menstruation with regular cycle: Secondary | ICD-10-CM

## 2019-10-12 DIAGNOSIS — N949 Unspecified condition associated with female genital organs and menstrual cycle: Secondary | ICD-10-CM | POA: Insufficient documentation

## 2019-10-14 ENCOUNTER — Ambulatory Visit: Payer: BC Managed Care – PPO | Attending: Registered Nurse | Admitting: Registered Nurse

## 2019-10-14 ENCOUNTER — Other Ambulatory Visit: Payer: Self-pay

## 2019-10-14 ENCOUNTER — Encounter (HOSPITAL_BASED_OUTPATIENT_CLINIC_OR_DEPARTMENT_OTHER): Payer: Self-pay | Admitting: Registered Nurse

## 2019-10-14 VITALS — BP 111/72 | HR 79 | Ht 62.0 in | Wt 111.3 lb

## 2019-10-14 DIAGNOSIS — Z8619 Personal history of other infectious and parasitic diseases: Secondary | ICD-10-CM | POA: Insufficient documentation

## 2019-10-14 DIAGNOSIS — Z309 Encounter for contraceptive management, unspecified: Secondary | ICD-10-CM

## 2019-10-14 DIAGNOSIS — Z0000 Encounter for general adult medical examination without abnormal findings: Secondary | ICD-10-CM | POA: Insufficient documentation

## 2019-10-14 DIAGNOSIS — Z3041 Encounter for surveillance of contraceptive pills: Secondary | ICD-10-CM

## 2019-10-14 DIAGNOSIS — Z793 Long term (current) use of hormonal contraceptives: Secondary | ICD-10-CM | POA: Insufficient documentation

## 2019-10-14 MED ORDER — NORETHINDRONE ACETATE 1 MG-ETHINYL ESTRADIOL 20 MCG TABLET
1.0000 | ORAL_TABLET | Freq: Every day | ORAL | 4 refills | Status: DC
Start: 2019-10-14 — End: 2020-08-21

## 2019-10-14 NOTE — Progress Notes (Signed)
Department of Obstetrics & Gynecology    Name: Berthenia Crosier  MRN: [id number]  Date: 10/14/19    Patient seen by Tobey Grim, APRN, CNM  Subjective:      Barbara Graves is a 24 y.o. female here for routine annual exam.    Current Concerns: wants to switch to lower dose OCP      Gynecologic History  Patient's last menstrual period was 10/10/2019.  Menses: Her periods are usually pretty normal. She continuous cycles her OCP to minimize periods.  For the last few months she has had spotting after any penetration (sex and masturbation) along with cramping.  Was previously on Loestrin and changed to higher dose pill due to a medication she was on that decreases the efficacy of OCP. She is no longer on that medication and wishes to return to lower dose pill.    She had chlamydia in 08/2019. Her and her partner were treated. TOC completed a few weeks ago was negative.  Pelvic US was completed and was unremarkable.  She has had no other new partners.     Current Contraception: OCP Teaching laboratory technician)  Desired Contraception: lower dose OCP  Last Pap: she has never had one    History  OB History     Gravida   0    Para   0    Term   0    Preterm   0    AB   0    Living   0       SAB   0    TAB   0    Ectopic   0    Multiple   0    Live Births   0               Past Medical History:   Diagnosis Date   . Narcolepsy    . STD (sexually transmitted disease)     HVS2     Past Surgical History:   Procedure Laterality Date   . HX NO SURGICAL PROCEDURES     . HX WISDOM TEETH EXTRACTION        Family Medical History:     Problem Relation (Age of Onset)    No Known Problems Mother, Father, Maternal Grandmother, Maternal Grandfather, Paternal Grandmother, Paternal Grandfather        Current Outpatient Medications   Medication Sig Dispense Refill   . armodafinil (NUVIGIL ORAL) Take by mouth     . azithromycin (ZITHROMAX) 500 mg Oral Tablet Take 2 Tabs (1,000 mg total) by mouth Once a day (Patient not taking: Reported on 10/14/2019) 2 Tab  0   . L norgesterol-E estradiol/E estradiol (ASHLYNA) 0.15 mg-30 mcg/10 mcg Oral tablet Take 1 Tab by mouth Once a day 91 Tab 3   . Methylphenidate (RITALIN) 20 mg Oral Tablet Take 20 mg by mouth Once per day as needed Immediate release - PRN     . Methylphenidate (RITALIN) 20 mg Oral Tablet Take 30 mg by mouth Once a day Extended release every morning     . valACYclovir (VALTREX) 500 mg Oral Tablet Take 1 Tab (500 mg total) by mouth Twice daily 6 Tab 5   . valACYclovir (VALTREX) 500 mg Oral Tablet Take 1 Tab (500 mg total) by mouth Twice daily 60 Tab 12     No current facility-administered medications for this visit.      No Known Allergies    Social History  Marital Status: Boyfriend  Abuse Screen: Denies    Review of Systems  General: Denies fever, chills, fatigue, unexplained weight change  Psych: Denies mood changes such as anxiety, depression  Cardio/Resp: Denies palpitations, clotting disorders, SOB, difficulty breathing, chest congestion, chest pain  Extremities: Denies tremors, weakness, numbness, tingling, edema  GI: Denies constipation, diarrhea, hemorrhoids  GU: Denies dysuria, urinary frequency, incontinence, urinary hesitancy, urinary urgency.  Repro: Denies abnormal vaginal discharge, pelvic pain, painful intercourse.    Objective:   BP 111/72   Pulse 79   Ht 1.575 m (5\' 2" )   Wt 50.5 kg (111 lb 5.3 oz)   LMP 10/10/2019   BMI 20.36 kg/m     General:     No acute distress, appears well.   Psych:  Oriented, normal mood. Appropriate responses.        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 Education Reviewed: exercise, safety, wellness  2. Contraception: Rx for Loestrin provided.  3. STI screen likely not needed today.  4. Offered to do pap today and she declines. Agreeable to returning.  5. Follow up: 10mo for annual exam with pap    CNM saw patient independently. Physician available on-site for consultation, collaboration, or referral as  per CNM certification and licensure.  Tobey Grim, APRN, CNM, WHNP-BC

## 2019-11-04 ENCOUNTER — Ambulatory Visit (INDEPENDENT_AMBULATORY_CARE_PROVIDER_SITE_OTHER): Payer: BC Managed Care – PPO | Admitting: NURSE PRACTITIONER-WOMENS HEALTH

## 2019-11-04 ENCOUNTER — Encounter (FREE_STANDING_LABORATORY_FACILITY)
Admit: 2019-11-04 | Discharge: 2019-11-04 | Disposition: A | Discharge status: Home / Self Care | Payer: BC Managed Care – PPO | Attending: NURSE PRACTITIONER-WOMENS HEALTH | Admitting: NURSE PRACTITIONER-WOMENS HEALTH

## 2019-11-04 ENCOUNTER — Other Ambulatory Visit: Payer: Self-pay

## 2019-11-04 ENCOUNTER — Encounter (FREE_STANDING_LABORATORY_FACILITY): Payer: BC Managed Care – PPO | Admitting: NURSE PRACTITIONER-WOMENS HEALTH

## 2019-11-04 ENCOUNTER — Encounter (INDEPENDENT_AMBULATORY_CARE_PROVIDER_SITE_OTHER): Payer: Self-pay | Admitting: NURSE PRACTITIONER-WOMENS HEALTH

## 2019-11-04 VITALS — BP 112/75 | HR 87 | Temp 98.0°F | Resp 18 | Ht 62.0 in | Wt 111.0 lb

## 2019-11-04 DIAGNOSIS — N898 Other specified noninflammatory disorders of vagina: Secondary | ICD-10-CM

## 2019-11-04 MED ORDER — METRONIDAZOLE 500 MG TABLET
500.00 mg | ORAL_TABLET | Freq: Two times a day (BID) | ORAL | 0 refills | Status: AC
Start: 2019-11-04 — End: 2019-11-11

## 2019-11-04 NOTE — Progress Notes (Signed)
[place]  [place] [phone]    PATIENT NAME:  Barbara Graves  MRN:  [id number]  DOB:  12-Nov-1995  DATE OF SERVICE: 11/04/2019    Chief Complaint   Patient presents with   . STI Screening   . Vaginal Discharge     x several weeks    . Vaginal Odor       History of Present Illness: Barbara Graves is a 24 y.o. female who presents to [place] today for the above complaint.  HPI    Patient admits to   new onset of vaginal discharge and vaginal odor x 1 week  Patient denies  exposure to STI  Known exposure to no known diseases.   Patient has history of  chlamydia greater than 2 months ago that was  azithromycin.   Last sexual contact 25Month ago with 1 partners.  Was this contact consensual? yes   Contact protected: no   Condom use: 75%-50%of the time.   Sexual partners in last 60 days: 1  Lifetime sexual partners: 61  Gender of current/past partners Female.  Contraception method: oral contraceptives  Participates in oral vaginal sex.   Hx of IVDU or intranasal DU in self or past/present partners: no  Has paid or has been paid for sex in the past: no  Have you completed the HPV vaccine series: yes           Past Medical History:    Past Medical History:   Diagnosis Date   . Narcolepsy    . STD (sexually transmitted disease)     HVS2         Past Surgical History:    Past Surgical History:   Procedure Laterality Date   . HX NO SURGICAL PROCEDURES     . HX WISDOM TEETH EXTRACTION           Allergies:  No Known Allergies  Medications:  Outpatient Medications Marked as Taking for the 11/04/19 encounter (Office Visit) with Marcha Solders A, APRN,WHNP-BC   Medication Sig   . armodafinil (NUVIGIL ORAL) Take by mouth   . L norgesterol-E estradiol/E estradiol (ASHLYNA) 0.15 mg-30 mcg/10 mcg Oral tablet Take 1 Tab by mouth Once a day   . Methylphenidate (RITALIN) 20 mg Oral Tablet Take 20 mg by mouth Once per day as needed Immediate  release - PRN   . Methylphenidate (RITALIN) 20 mg Oral Tablet Take 30 mg by mouth Once a day Extended release every morning   . metroNIDAZOLE (FLAGYL) 500 mg Oral Tablet Take 1 Tablet (500 mg total) by mouth Twice daily for 7 days   . valACYclovir (VALTREX) 500 mg Oral Tablet Take 1 Tab (500 mg total) by mouth Twice daily     Social History:    Social History     Tobacco Use   . Smoking status: Never Smoker   . Smokeless tobacco: Never Used   Substance Use Topics   . Alcohol use: Yes      Family History:  Family Medical History:     Problem Relation (Age of Onset)    No Known Problems Mother, Father, Maternal Grandmother, Maternal Grandfather, Paternal 36, Paternal Grandfather            Review of Systems:  Review of Systems   Constitutional: Negative for chills and fever.   Gastrointestinal: Negative for nausea and vomiting.   Genitourinary: Positive for  vaginal discharge and vaginal pain. Negative for dysuria, frequency, genital sores and pelvic pain.     Physical Exam:  Vitals:    11/04/19 1526   BP: 112/75   Pulse: 87   Resp: 18   Temp: 36.7 C (98 F)   TempSrc: Tympanic   SpO2: 99%   Weight: 50.3 kg (111 lb)   Height: 1.575 m (5\' 2" )   BMI: 20.34         Body mass index is 20.3 kg/m.  Physical Exam  Exam conducted with a chaperone present.   Constitutional:       Appearance: She is well-developed.   HENT:      Head: Normocephalic and atraumatic.   Genitourinary:     Exam position: Lithotomy position.      Labia:         Right: No rash, tenderness or lesion.         Left: No rash, tenderness or lesion.       Vagina: Vaginal discharge and erythema present.      Cervix: Friability present. No cervical motion tenderness, discharge or lesion.      Uterus: Not enlarged and not tender.       Adnexa:         Right: No mass, tenderness or fullness.          Left: No mass, tenderness or fullness.     Musculoskeletal:         General: Normal range of motion.   Skin:     General: Skin is warm and dry.      Neurological:      Mental Status: She is alert and oriented to person, place, and time.   Psychiatric:         Behavior: Behavior normal.         Thought Content: Thought content normal.         Judgment: Judgment normal.       Ortho Exam    Data Reviewed:    POCT Results:       Provider Performed POCT 03/31/2018 09/03/2018 09/23/2018 10/25/2018 12/13/2018 11/04/2019   [facility] [address] [place], [place] [address] [facility] [address], [place], [place] [address] [facility] [address] [place], [place] [address] [facility] [address], [place], [place] [address] [facility] [address], [place], [place] [address] [facility] [address] [place],  [address]   Time Fri Nov 04, 2019  3:54 PM Fri Nov 04, 2019  3:54 PM Fri Nov 04, 2019  3:54 PM Fri Nov 04, 2019  3:54 PM Fri Nov 04, 2019  3:54 PM Fri Nov 04, 2019  3:54 PM   Patient Gender - Female Female - - Female   Time -  2:10 PM  3:05 PM - -  3:53 PM   Trichomonas Test  None Seen None Seen None Seen None Seen None Seen None Seen   Trichomonas Reference Range (Required) None Seen None Seen None Seen None Seen None Seen None Seen   Yeast Test  Seen None Seen None Seen Seen Seen None Seen   Yeast Ref Range(Required) None Seen None Seen None Seen None Seen None Seen None Seen   Clue Cells Test None Seen Seen None Seen None Seen None Seen Seen   Clue Cells Ref Range (Required) None Seen None Seen None Seen None Seen None Seen None Seen   Specimen Source Vaginal Fluid Vaginal Fluid  Vaginal Fluid Vaginal Fluid Vaginal Fluid Vaginal Fluid   Additional Info positive WBC's - - - - -            I have reviewed and confirmed the above point of care results.  Dema Severin, APRN,WHNP-BC 11/04/2019, 15:54  [facility] labs ordered    Assessment:   1. Vaginal discharge    2. Vaginal odor      Plan:    Orders Placed This Encounter   . NEISSERIA GONORRHOEAE DNA BY PCR   . CHLAMYDIA TRACHOMITIS DNA BY PCR (INHOUSE)   . POCT VAGINAL KOH &  SALINE ([place] ONLY)   . metroNIDAZOLE (FLAGYL) 500 mg Oral Tablet   STD testing will be back in a few days and we will call with results all positive STDs are reported to the [facility] per [facility] law. The best way to protect yourself from STDs is reducing number of partners and 100% condom use     BV Flagyl 500 mg PO BID x 7 days no ETOH 3 days before the medication while pt is taking it and 3 days after medication. Pt advised to only shower with dove soap and washcloth no scented products use only unscented pads and tampons no gain detergent       Return if symptoms worsen or fail to improve.  Dema Severin, APRN,WHNP-BC

## 2019-11-04 NOTE — Patient Instructions (Addendum)
Vaginal Infection: Bacterial Vaginosis  Both good and bad bacteria are present in a healthy vagina. Bacterial vaginosis (BV) occurs when these bacteria get out of balance. The numbers of good bacteria decrease. This allows the numbers of bad bacteria to increase and cause BV. In most cases, BV is not a serious problem.   Causes of bacterial vaginosis  The cause of BV is not clear. Douching may lead to it. Having sex with a new partner or more than1 partner makes it more likely.   Symptoms of bacterial vaginosis  Symptoms of BV vary for each woman. Some women have few symptoms or none at all. If symptoms are present, they can include:    Thin, milky white or gray or sometimes green discharge   Unpleasant "fishy" odor   Irritation, itching, and burning at opening of vagina. This may mean it's caused by more than one type of bacteria.   Burning or irritation with sex or urination. This may mean it's caused by more than one type of bacteria.  Diagnosing bacterial vaginosis  Your healthcare provider will ask about your symptoms and health history. He or she will also do a pelvic exam. This is an exam of your vagina and cervix. A sample of vaginal fluid or discharge may be taken. This sample is checked for signs of BV.   Treating bacterial vaginosis  BV is often treated with antibiotics. They may be given in oral pill form or as a vaginal cream. To use these medicines:    Be sure to take all of your medicine, even if your symptoms go away.   If you're taking antibiotic pills, don't drink alcohol until you're finished with all of your medicine.   If you're using vaginal cream, apply it as directed. Be aware that the cream may make condoms and diaphragms less effective.   Call your healthcare provider if symptoms dot go away within 4 days of starting treatment. Also call if you have a reaction to the medicine.  Why treatment matters  Even if you have no symptoms or your symptoms are mild, BV should be treated.  Untreated BV can lead to health problems such as:    Increased risk for preterm delivery if you're pregnant   Increased risk for complications after surgery on the reproductive organs   Possible increased risk for pelvic inflammatory disease (PID)  StayWell last reviewed this educational content on 12/22/2018   2000-2020 [employer]. All rights reserved. This information is not intended as a substitute for professional medical care. Always follow your healthcare professional's instructions.        [place]     Operated by [facility]  [place], [place] [address]  Phone: [phone]  Fax: [phone]  [email or web address]  Twitter [email or web address]   Closed all  Holidays        Attending Caregiver: Dema Severin, APRN,WHNP-BC    Today's orders:   Orders Placed This Encounter   . NEISSERIA GONORRHOEAE DNA BY PCR   . CHLAMYDIA TRACHOMITIS DNA BY PCR (INHOUSE)   . POCT VAGINAL KOH & SALINE ([facility] ONLY)   . metroNIDAZOLE (FLAGYL) 500 mg Oral Tablet         Prescription(s) E-Rx to:  [id number] [place], [place]    Future appts with Student [facility]  Visit date not found    [place]. THIS IS ONE EASY WAY TO [place]  CLINIC.  SEE THE CODE ON THIS FORM FOR ACCESS.    -----------------------------PRIVACY INFORMATION-----------------------------------------------  As a [employer] student, regardless of your age, we cannot discuss your personal health information with a parent, spouse, family member or anyone else without your expressed consent.    This policy does not include:  Individuals who would have a legitimate reason to access your records and information to assist in your care under the provisions of HIPAA ([place] and [place]) law;   Individuals with whom you have previously given expressed written consent to do so, such a  legal guardian or Power of Attorney.    No one can access your [facility], unless you give them your account sign on and password. You will receive emails from [place].  This means anyone who has access to the email account you provided can see this notification. There will be no private medical information included in these emails. This notification of  new medical information  available in your [facility], may be information that you do not want others to know.   _______________________________________________________________________    If your symptoms persist, worsen or you develop any new or concerning symptoms please call [facility] for at [phone] for a follow up appointment.   If your symptoms are severe, go immediately to the emergency department or call 911.  Please check with your health insurance coverage for these types of visits.   Please keep all follow up visit recommended at today's visit.    If you received x-rays during your visit, be aware that the final and formal interpretation of those films by a radiologist will occur after your discharge.  If there is a significant discrepancy identified after your discharge, we will contact you at the telephone number provided during registration.  These results are available for your review on [facility].    Please refer to your [facility] for lab test results. Lab test results related to HIV, STI's and pregnancy are considered private and are therefore not immediately visible in your [facility], we may still be able to send a generic MyChart message for these results.  If you have cultures pending for sexually transmitted diseases, you will be contacted by phone.     Positive cultures are reported to the [place] Department of Health, as required by state law. These results are considered private on [facility] and can only be released by the provider.We will not contact you if the results are negative.    It is very important that we have a phone  number that is the single best way to contact you in the event that we become aware of important clinical information or concerns after your discharge.  If the phone number you provided at registration is NOT this number you should inform staff and registration prior to leaving.     Please call [facility] [place] at [phone] with any further questions.    Instructions discussed with patient upon discharge by clinical staff with all questions answered.    Dema Severin, APRN,WHNP-BC 11/04/2019, 15:53

## 2019-11-06 LAB — NEISSERIA GONORRHOEAE DNA BY PCR: NEISSERIA GONORRHOEAE PCR: NOT DETECTED

## 2019-11-06 LAB — CHLAMYDIA TRACHOMITIS DNA BY PCR (INHOUSE): CHLAMYDIA TRACHOMATIS PCR: NOT DETECTED

## 2019-12-12 ENCOUNTER — Ambulatory Visit (HOSPITAL_BASED_OUTPATIENT_CLINIC_OR_DEPARTMENT_OTHER): Payer: Self-pay | Admitting: Registered Nurse

## 2019-12-21 ENCOUNTER — Encounter (HOSPITAL_BASED_OUTPATIENT_CLINIC_OR_DEPARTMENT_OTHER): Payer: Self-pay | Admitting: Registered Nurse

## 2020-01-03 ENCOUNTER — Encounter (HOSPITAL_BASED_OUTPATIENT_CLINIC_OR_DEPARTMENT_OTHER): Payer: Self-pay | Admitting: Registered Nurse

## 2020-01-03 ENCOUNTER — Ambulatory Visit: Payer: BC Managed Care – PPO | Attending: Registered Nurse | Admitting: Registered Nurse

## 2020-01-03 ENCOUNTER — Other Ambulatory Visit: Payer: Self-pay

## 2020-01-03 VITALS — BP 122/87 | HR 95 | Temp 97.8°F | Ht 62.0 in | Wt 108.2 lb

## 2020-01-03 DIAGNOSIS — Z1151 Encounter for screening for human papillomavirus (HPV): Secondary | ICD-10-CM | POA: Insufficient documentation

## 2020-01-03 DIAGNOSIS — Z3041 Encounter for surveillance of contraceptive pills: Secondary | ICD-10-CM

## 2020-01-03 DIAGNOSIS — Z01411 Encounter for gynecological examination (general) (routine) with abnormal findings: Secondary | ICD-10-CM

## 2020-01-03 DIAGNOSIS — Z113 Encounter for screening for infections with a predominantly sexual mode of transmission: Secondary | ICD-10-CM

## 2020-01-03 DIAGNOSIS — Z124 Encounter for screening for malignant neoplasm of cervix: Secondary | ICD-10-CM | POA: Insufficient documentation

## 2020-01-03 DIAGNOSIS — N898 Other specified noninflammatory disorders of vagina: Secondary | ICD-10-CM

## 2020-01-03 MED ORDER — SOLOSEC 2 GRAM ORAL DR GRANULES IN PACKET
1.0000 | GRANULES | Freq: Once | ORAL | 0 refills | Status: AC
Start: 2020-01-03 — End: 2020-01-03

## 2020-01-03 NOTE — H&P (Signed)
Department of Obstetrics & Gynecology    Name: Barbara Graves  MRN: [id number]  Date: 01/03/20    Patient seen by Tobey Grim, APRN, CNM  Subjective:      Barbara Graves is a 24 y.o. female here for pap screening.     Gynecologic History  No LMP recorded. (Menstrual status: Continuous Cycle OCP).  Menses: Not having any due to use of OCP  Current Contraception: Continuous cycle OCP  Last Pap: She has not yet had a pap.  Prior STI History:   - Chlamydia 08/2019. S/P treatment with negative TOC follow up.    History  OB History     Gravida   0    Para   0    Term   0    Preterm   0    AB   0    Living   0       SAB   0    TAB   0    Ectopic   0    Multiple   0    Live Births   0               Past Medical History:   Diagnosis Date    Narcolepsy     STD (sexually transmitted disease)     HSV2     Past Surgical History:   Procedure Laterality Date    HX NO SURGICAL PROCEDURES      HX WISDOM TEETH EXTRACTION        Family Medical History:     Problem Relation (Age of Onset)    No Known Problems Mother, Father, Maternal Grandmother, Maternal Grandfather, Paternal Grandmother, Paternal Grandfather        Current Outpatient Medications   Medication Sig Dispense Refill    armodafinil (NUVIGIL ORAL) Take by mouth      L norgesterol-E estradiol/E estradiol (ASHLYNA) 0.15 mg-30 mcg/10 mcg Oral tablet Take 1 Tab by mouth Once a day 91 Tab 3    Methylphenidate (RITALIN) 20 mg Oral Tablet Take 20 mg by mouth Once per day as needed Immediate release - PRN      Methylphenidate (RITALIN) 20 mg Oral Tablet Take 30 mg by mouth Once a day Extended release every morning      Norethindrone Ac-Eth Estradiol (equiv to: LOESTRIN 1-20) 1-20 mg-mcg Oral Tablet Take 1 Tab by mouth Once a day (Patient not taking: Reported on 11/04/2019) 63 Tab 4    valACYclovir (VALTREX) 500 mg Oral Tablet Take 1 Tab (500 mg total) by mouth Twice daily 60 Tab 12     No current facility-administered medications for this visit.     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   Abuse Screen: Negative  Substance Use: E-Cigs. No other illicit substance use. 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 Aware of how to proceed with cessation of E-Cigs.  Caffeine: Not concerning use    Review of Systems  General: Generally feeling well.   Psych: Reports her mood is currently stable.  Cardio/Resp: Not current experiencing symptoms.   Extremities: No movement disorder or change is reported.  GI: Denies constipation, diarrhea, hemorrhoids  GU: No current urinary symptoms of concern  Repro: Currently experiencing abnormal vaginal discharge c/w BV infection.    Objective:   BP 122/87  Pulse 95    Temp 36.6 C (97.8 F) (Thermal Scan)    Ht 1.575 m (5\' 2" )    Wt 49.1 kg (108 lb 3.9 oz)    BMI 19.80 kg/m     General:     No acute distress, appears well.   HEENT:  Normocephalic.   Thyroid:  Normal to inspection and palpation   Lymph Nodes:  Cervical, supraclavicular, and axillary nodes normal.   Breasts:  Declined   Lungs:  Clear to auscultation bilaterally. Easy, unlabored breathing, no use of accessory muscles.    Heart:  Regular rate and rhythm, S1, S2 normal, no murmur, click, rub or gallop   Abdomen:  Soft, non-tender. No masses,  no organomegaly   Extremities:  Extremities normal, atraumatic, no cyanosis or edema.   Psych:  Oriented, normal mood. Appropriate responses.    Vulva:  Normal female genitalia, no lesions   Vagina:  Normal mucosa, no discharge, no  lesions   Cervix:  Posterior position, nulliparous appearance. Mucous is present around the cervix that is white and opaque. Scant bleeding with pap.   Mucous:  Vaginal discharge is present that is whitish-yellow and opaque. It has a slippery appearance consistent with BV.      Assessment:     Healthy 88 female exam  Screening for Cervical Cancer  Abnormal  Discharge with History of BV    Plan:     1. Education Reviewed: exercise, safety, wellness, ASCCP guidelines  2. Contraception: Continue OCP  3. Pap collected and sent.  4. Solosec ordered for suspected BV based on symptoms and appearance. Sent information for rebate card obtainment. Discussed we can easily change this to Flagyl PO if it is too expensive.  5. Follow up: 1 year for annual exam, before PRN    CNM saw patient independently. Physician available on-site for consultation, collaboration, or referral as per CNM certification and licensure.  Tobey Grim, APRN, CNM, WHNP-BC

## 2020-01-03 NOTE — Addendum Note (Signed)
Addended by: Kathryne Hitch on: 01/03/2020 11:08 AM     Modules accepted: Orders

## 2020-01-04 LAB — NEISSERIA GONORRHOEAE DNA BY PCR: NEISSERIA GONORRHOEAE PCR: NOT DETECTED

## 2020-01-04 LAB — CHLAMYDIA TRACHOMITIS DNA BY PCR (INHOUSE): CHLAMYDIA TRACHOMATIS PCR: NOT DETECTED

## 2020-01-04 MED ORDER — METRONIDAZOLE 500 MG TABLET
500.00 mg | ORAL_TABLET | Freq: Two times a day (BID) | ORAL | 0 refills | Status: AC
Start: 2020-01-04 — End: 2020-01-11

## 2020-02-06 LAB — CYTOPATHOLOGY, GYN +/- HIGH RISK HPV

## 2020-02-22 ENCOUNTER — Other Ambulatory Visit: Payer: Self-pay

## 2020-02-22 ENCOUNTER — Ambulatory Visit (INDEPENDENT_AMBULATORY_CARE_PROVIDER_SITE_OTHER): Payer: BC Managed Care – PPO

## 2020-02-22 VITALS — Temp 98.7°F

## 2020-02-22 DIAGNOSIS — Z111 Encounter for screening for respiratory tuberculosis: Secondary | ICD-10-CM

## 2020-02-22 DIAGNOSIS — Z0184 Encounter for antibody response examination: Secondary | ICD-10-CM

## 2020-02-22 NOTE — Patient Instructions (Signed)
Tuberculin purified protein derivative, PPD injection   What is this medicine?   TUBERCULIN PURIFIED PROTEIN DERIVATIVE, PPD is a test used to detect if you have a tuberculosis infection. It will not cause tuberculosis infection.   This medicine may be used for other purposes; ask your health care provider or pharmacist if you have questions.   COMMON BRAND NAME(S): Aplisol, Tubersol     What should I tell my health care provider before I take this medicine?   They need to know if you have any of these conditions:   -diabetes   -HIV or AIDS   -immune system problems   -infection (especially a virus infection such as chickenpox, cold sores, or herpes)   -kidney disease   -malnutrition   -an unusual or allergic reaction to tuberculin purified protein derivative, PPD, other medicines, foods, dyes, or preservatives   -pregnant or trying to get pregnant   -breast-feeding     How should I use this medicine?   This medicine is for injection in the skin. It is given by a health care professional in a hospital or clinic setting.   Talk to your pediatrician regarding the use of this medicine in children. While this drug may be prescribed in children, precautions do apply.   Overdosage: If you think you've taken too much of this medicine contact a poison control center or emergency room at once.   Overdosage: If you think you have taken too much of this medicine contact a poison control center or emergency room at once.   NOTE: This medicine is only for you. Do not share this medicine with others.     What if I miss a dose?   It is important not to miss your dose. Call your doctor or health care professional if you are unable to keep an appointment.     What may interact with this medicine?   -adalimumab   -anakinra   -etanercept   -infliximab   -live virus vaccines   -medicines to treat cancer   -steroid medicines like prednisone or cortisone   This list may not describe all possible interactions. Give your health care  provider a list of all the medicines, herbs, non-prescription drugs, or dietary supplements you use. Also tell them if you smoke, drink alcohol, or use illegal drugs. Some items may interact with your medicine.     What should I watch for while using this medicine?   See your health care provider as directed.   This medicine does not protect against tuberculosis.     What side effects may I notice from receiving this medicine?   Side effects that you should report to your doctor or health care professional as soon as possible:   -allergic reactions like skin rash, itching or hives, swelling of the face, lips, or tongue   -breathing problems   Side effects that usually do not require medical attention (Report these to your doctor or health care professional if they continue or are bothersome.):   -bruising   -pain, redness, or irritation at site where injected   This list may not describe all possible side effects. Call your doctor for medical advice about side effects. You may report side effects to FDA at 1-800-FDA-1088.     Where should I keep my medicine?   This drug is given in a hospital or clinic and will not be stored at home.   NOTE: This sheet is a summary. It may not cover all   possible information. If you have questions about this medicine, talk to your doctor, pharmacist, or health care provider.    2013, [employer]. (11/01/2009 1:24:18 PM)

## 2020-02-22 NOTE — Nursing Note (Signed)
Labs drawn from patient's left arm on first attempt.1 Gold tubes collected. Patient tolerated well. Bandage placed and labs sent. Barbara Graves, [place] 02/22/2020, 13:44

## 2020-02-22 NOTE — Addendum Note (Signed)
Addended by: Santiago Bumpers on: 02/22/2020 04:05 PM     Modules accepted: Orders

## 2020-02-24 NOTE — Addendum Note (Signed)
Addended by: Carlynn Spry on: 02/24/2020 07:57 AM     Modules accepted: Miquel Dunn

## 2020-02-24 NOTE — Nursing Note (Signed)
Barbara Graves returns to the [facility] for reading of the PPD.   PPD reading was negative with 0 mm induration, right forearm.   Documentation of PPD results given to patient.   Lamarr Lulas, RN 02/24/2020, 17:21

## 2020-02-29 ENCOUNTER — Encounter (FREE_STANDING_LABORATORY_FACILITY)
Admit: 2020-02-29 | Discharge: 2020-02-29 | Disposition: A | Discharge status: Home / Self Care | Payer: BC Managed Care – PPO | Attending: Emergency Medicine | Admitting: Emergency Medicine

## 2020-02-29 ENCOUNTER — Encounter (FREE_STANDING_LABORATORY_FACILITY): Payer: BC Managed Care – PPO | Admitting: Emergency Medicine

## 2020-02-29 ENCOUNTER — Ambulatory Visit (INDEPENDENT_AMBULATORY_CARE_PROVIDER_SITE_OTHER): Payer: BC Managed Care – PPO

## 2020-02-29 ENCOUNTER — Other Ambulatory Visit: Payer: Self-pay

## 2020-02-29 ENCOUNTER — Telehealth (INDEPENDENT_AMBULATORY_CARE_PROVIDER_SITE_OTHER): Payer: Self-pay

## 2020-02-29 DIAGNOSIS — Z0184 Encounter for antibody response examination: Secondary | ICD-10-CM | POA: Insufficient documentation

## 2020-02-29 LAB — HEPATITIS B SURFACE ANTIBODY: HBV SURFACE ANTIBODY QUANTITATIVE: 0 m[IU]/mL (ref <8)

## 2020-02-29 NOTE — Progress Notes (Signed)
[facility]  [address]  [place] [address]  [phone]    Patient Name: Barbara Graves  MRN: [id number]  DOB:  1996-01-12  Date of Service: 02/29/2020      Chief Complaint   Patient presents with   . Immunizations       History of Present Illness: Cassi Jenne   is a 24 y.o. who presents to [facility] today for vaccine titers for school enrollment requirements. Past immunizations up to date per patient.    No other complaints today.      Physical Exam:  There were no vitals filed for this visit.      There is no height or weight on file to calculate BMI..  No LMP recorded. (Menstrual status: Continuous Cycle OCP).  Labs drawn from left [facility]. 1 gold top tube(s) drawn. Band-aid and 2 x 2 placed and hemostasis obtained. Patient tolerated well.  Assessment:     ICD-10-CM    1. Antibody response examination  Z01.84 MEASLES IGG     MUMPS IGG ANTIBODY     RUBELLA VIRUS ANTIBODIES, IGG, SERUM     VARICELLA ZOSTER IGG     HEPATITIS B SURFACE ANTIBODY       Plan:    School required titer paperwork reviewed with patient. Options and pricing for cash only verses possible insurance reimbursement discussed with patient. Titers completed at they request as below.  Mumps IgG Antibodies, Measles (rubeola) IgG Ab, Rubella Ab, Varicella Zoster IgG Ab and Ab to Hepatitis B Antigen titer order(s) were pended for provider signature today. and hand written on [facility] titer requisition for patient to take to [facility] office on [address].  1-   MY [place] Chart instructions given to patient in AVS. Patient will log in to their My[facility] for results.    Lamarr Lulas, RN 02/29/2020, 12:05

## 2020-02-29 NOTE — Nursing Note (Signed)
Patient called in asking for her titer results. Per chart review patient's titers were cancelled. Called [facility] and [facility] stated they did not receive any lab specimens on 02/22/2020 for titers so orders were cancelled. Notified patient of this and advised her to come have titers re-drawn. Patient verbalized understanding and denied further questions. Lamarr Lulas, RN  02/29/2020, 11:29

## 2020-03-01 ENCOUNTER — Ambulatory Visit (INDEPENDENT_AMBULATORY_CARE_PROVIDER_SITE_OTHER): Payer: BC Managed Care – PPO

## 2020-03-01 DIAGNOSIS — Z23 Encounter for immunization: Secondary | ICD-10-CM

## 2020-03-01 NOTE — Nursing Note (Signed)
Patient Name: Barbara Graves  MRN# [id number]  DOB: 26-Nov-1995       Barbara Graves is a 24 y.o. who presents to [facility] today for administration/placement of Hep B for school enrollment requirements. No past vaccine reactions recalled.      Screening Checklist for Contraindications to Vaccines for Adults   1. Are you sick today?  No  2. Do you have allergies to medications, food, a vaccine component, or latex?  No  3. Have you ever had a serious reaction after receiving a vaccination?  No  4. Do you have a long-term health problem with heart disease, lung disease, asthma, kidney disease, metabolic disease (e.g., diabetes), anemia, or other blood disorder? No  5. Do you have cancer, leukemia, HIV/AIDS, or any other immune system problem?  No  6. In the past 3 months, have you taken medications that weaken your immune system, such as cortisone, prednisone, other steroids, or anticancer drugs, or have you had radiation treatments? No  7. Have you had a seizure or a brain or other nervous system problem? No  8. During the past year, have you received a transfusion of blood or blood products, or been given immune (gamma) globulin or an antiviral drug? No  9. For women: Are you pregnant or is there a chance you could become pregnant during the next month?  No  10. Have you received any vaccinations in the past 4 weeks? No  Did you bring your immunization record card with you?   It is important for you to have a personal record of your vaccinations. No  There were no vitals taken for this visit.      No LMP recorded. (Menstrual status: Continuous Cycle OCP).  Immunization administered     Name Date Dose VIS Date Route    HEPATITIS B VIRUS RECOMB VACCINE(ADMIN) 03/01/2020 1 mL 05/06/2018 Intramuscular    Site: Left deltoid    Given By: Emilio Aspen    Manufacturer: [employer]    Lot: [id number]    NDC: [id number]          She was instructed to  please remain 20 minutes after the injection and to report a  reaction to the nurse as soon as she suspects it.  She was also given written instructions.  Emilio Aspen 03/01/2020, 11:47

## 2020-03-02 LAB — VARICELLA-ZOSTER ANTIBODY, IGG, SERUM: VZV IGG QUALITATIVE: POSITIVE

## 2020-03-02 LAB — MEASLES IGG: MEASLES IGG QUALITATIVE: POSITIVE

## 2020-03-02 LAB — MUMPS VIRUS ANTIBODY, IGG, SERUM: MUMPS IGG QUALITATIVE: POSITIVE

## 2020-03-02 LAB — RUBELLA VIRUS ANTIBODIES, IGG, SERUM: RUBELLA IGG QUALITATIVE: UNDETERMINED — AB

## 2020-04-03 ENCOUNTER — Encounter (HOSPITAL_BASED_OUTPATIENT_CLINIC_OR_DEPARTMENT_OTHER): Payer: Self-pay | Admitting: Registered Nurse

## 2020-04-13 ENCOUNTER — Encounter (FREE_STANDING_LABORATORY_FACILITY)
Admit: 2020-04-13 | Discharge: 2020-04-13 | Disposition: A | Discharge status: Home / Self Care | Payer: BC Managed Care – PPO | Attending: NURSE PRACTITIONER-WOMENS HEALTH | Admitting: NURSE PRACTITIONER-WOMENS HEALTH

## 2020-04-13 ENCOUNTER — Encounter (FREE_STANDING_LABORATORY_FACILITY): Payer: BC Managed Care – PPO | Admitting: NURSE PRACTITIONER-WOMENS HEALTH

## 2020-04-13 ENCOUNTER — Ambulatory Visit (INDEPENDENT_AMBULATORY_CARE_PROVIDER_SITE_OTHER): Payer: BC Managed Care – PPO | Admitting: NURSE PRACTITIONER-WOMENS HEALTH

## 2020-04-13 ENCOUNTER — Encounter (INDEPENDENT_AMBULATORY_CARE_PROVIDER_SITE_OTHER): Payer: Self-pay | Admitting: NURSE PRACTITIONER-WOMENS HEALTH

## 2020-04-13 ENCOUNTER — Other Ambulatory Visit: Payer: Self-pay

## 2020-04-13 VITALS — BP 129/85 | HR 90 | Temp 97.0°F | Resp 18 | Ht 62.0 in | Wt 103.0 lb

## 2020-04-13 DIAGNOSIS — N898 Other specified noninflammatory disorders of vagina: Secondary | ICD-10-CM | POA: Insufficient documentation

## 2020-04-13 DIAGNOSIS — Z113 Encounter for screening for infections with a predominantly sexual mode of transmission: Secondary | ICD-10-CM

## 2020-04-13 LAB — HEPATITIS C ANTIBODY SCREEN WITH REFLEX TO HCV PCR: HCV ANTIBODY QUALITATIVE: NEGATIVE

## 2020-04-13 LAB — HIV1/HIV2 SCREEN, COMBINED ANTIGEN AND ANTIBODY: HIV SCREEN, COMBINED ANTIGEN & ANTIBODY: NEGATIVE

## 2020-04-13 MED ORDER — METRONIDAZOLE 500 MG TABLET
500.00 mg | ORAL_TABLET | Freq: Two times a day (BID) | ORAL | 0 refills | Status: AC
Start: 2020-04-13 — End: 2020-04-20

## 2020-04-13 NOTE — Progress Notes (Signed)
[facility] [place]  [place]  [place] [address]    PATIENT NAME:  Barbara Graves  MRN:  [id number]  DOB:  1996/08/02  DATE OF SERVICE: 04/13/2020    Chief Complaint   Patient presents with    Vaginal Irritation/Pain    STI Screening       History of Present Illness: Barbara Graves is a 24 y.o. female who presents to [facility] today for the above complaint.  HPI    Patient admits to   new onset of vaginal discharge , vaginal irritation, and odor x 2 weeks   Patient denies  exposure to STI  Known exposure to no known diseases.   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  Was this contact consensual? yes   Contact protected: no   Condom use: 25%-0%of the time.   Sexual partners in last 60 days: 1  Lifetime sexual partners: 86  Gender of current/past partners Female.  Contraception method: oral contraceptives  Participates in oral vaginal anal sex.   Hx of IVDU or intranasal DU in self or past/present partners: no  Has paid or has been paid for sex in the past: no  Have you completed the HPV vaccine series: yes           Past Medical History:    Past Medical History:   Diagnosis Date    Narcolepsy     STD (sexually transmitted disease)     HVS2         Past Surgical History:    Past Surgical History:   Procedure Laterality Date    HX NO SURGICAL PROCEDURES      HX WISDOM TEETH EXTRACTION           Allergies:  No Known Allergies  Medications:  Outpatient Medications Marked as Taking for the 04/13/20 encounter (Office Visit) with Dema Severin, APRN,WHNP-BC   Medication Sig    Methylphenidate (RITALIN) 20 mg Oral Tablet Take 30 mg by mouth Once a day Extended release every morning    metroNIDAZOLE (FLAGYL) 500 mg Oral Tablet Take 1 Tablet (500 mg total) by mouth Twice daily for 7 days     Norethindrone Ac-Eth Estradiol (equiv to: LOESTRIN 1-20) 1-20 mg-mcg Oral Tablet Take 1 Tab by mouth Once a day     Social History:    Social History     Tobacco Use    Smoking status: Never Smoker    Smokeless tobacco: Never Used    Tobacco comment: Patient uses a Juul with nicotine   Substance Use Topics    Alcohol use: Yes      Family History:  Family Medical History:     Problem Relation (Age of Onset)    No Known Problems Mother, Father, Maternal Grandmother, Maternal Grandfather, Paternal Grandmother, Paternal Grandfather            Review of Systems:  Review of Systems   Constitutional: Negative for chills and fever.   Gastrointestinal: Negative for nausea and vomiting.   Genitourinary: Positive for vaginal discharge and vaginal pain. Negative for dysuria, frequency, genital sores, menstrual problem and pelvic pain.     Physical Exam:  Vitals:    04/13/20 1304   BP: 129/85   Pulse: 90  Resp: 18   Temp: 36.1 C (97 F)   TempSrc: Thermal Scan   SpO2: 99%   Weight: 46.7 kg (103 lb)   Height: 1.575 m (5\' 2" )   BMI: 18.88         Body mass index is 18.84 kg/m.  Physical Exam  Exam conducted with a chaperone present.   Constitutional:       Appearance: She is well-developed.   HENT:      Head: Normocephalic and atraumatic.   Genitourinary:     Exam position: Lithotomy position.      Labia:         Right: No rash or lesion.         Left: No rash, tenderness or lesion.       Vagina: Erythema and bleeding present. No vaginal discharge or tenderness.      Cervix: No cervical motion tenderness, discharge, friability or lesion.      Uterus: Not enlarged and not tender.       Adnexa:         Right: No mass, tenderness or fullness.          Left: No mass, tenderness or fullness.         Musculoskeletal:         General: Normal range of motion.   Skin:     General: Skin is warm and dry.   Neurological:      Mental Status: She is alert and oriented to person, place, and time.   Psychiatric:         Behavior: Behavior  normal.         Thought Content: Thought content normal.         Judgment: Judgment normal.       Ortho Exam    Data Reviewed:    POCT Results:       Provider Performed POCT 03/31/2018 09/03/2018 09/23/2018 10/25/2018 12/13/2018 11/04/2019 04/13/2020   [facility] [address] [place], [place] [address] [facility] [address], [place], [place] [address] [facility] [address], [place], [place] [address] [facility] [address] [place], [place] [address] [facility] [address] [place], [place] [address] [facility] [address] [place], [place] [address] [facility] [address], [place], [place] [address]   Time Fri Apr 13, 2020  1:41 PM Fri Apr 13, 2020  1:41 PM Fri Apr 13, 2020  1:41 PM Fri Apr 13, 2020  1:41 PM Fri Apr 13, 2020  1:41 PM Fri Apr 13, 2020  1:41 PM Fri Apr 13, 2020  1:41 PM   Patient Gender - Female Female - - Female Female   Time -  2:10 PM  3:05 PM - -  3:53 PM  1:40 PM   Trichomonas Test  None Seen None Seen None Seen None Seen None Seen None Seen None Seen   Trichomonas Reference Range (Required) None Seen None Seen None Seen None Seen None Seen None Seen None Seen   Yeast Test  Seen None Seen None Seen Seen Seen None Seen None Seen   Yeast Ref Range(Required) None Seen None Seen None Seen None Seen None Seen None Seen None Seen   Clue Cells Test None Seen Seen None Seen None Seen None Seen Seen Seen   Clue Cells Ref Range (Required) None Seen None Seen None Seen None Seen None Seen None Seen None Seen   Specimen Source Vaginal Fluid Vaginal Fluid  Vaginal Fluid Vaginal Fluid Vaginal Fluid Vaginal Fluid Vaginal Fluid   Additional Info positive WBC's - - - - - -            I have reviewed and confirmed the above point of care results.  Dema Severin, APRN,WHNP-BC 04/13/2020, 13:41  [facility] labs ordered    Assessment:   1. Vaginal discharge    2. Vaginal irritation    3. Vaginal odor    4. Screen for STD (sexually transmitted disease)      Plan:    Orders  Placed This Encounter    HIV1/HIV2 SCREEN, COMBINED ANTIGEN AND ANTIBODY    SYPHILIS SCREENING ALGORITHM WITH REFLEX (TITER, TP-PA), SERUM    HEPATITIS C ANTIBODY SCREEN WITH REFLEX TO HCV PCR    metroNIDAZOLE (FLAGYL) 500 mg Oral Tablet     STD testing will be back in a few days and we will call with results all positive STDs are reported to the [facility] per [facility] law. The best way to protect yourself from STDs is reducing number of partners and 100% condom use     BV Flagyl 500 mg PO BID x 7 days Pt advised to only shower with dove soap and washcloth no scented products use only unscented pads and tampons no gain detergent     Discussed using non scented products     Return if symptoms worsen or fail to improve.  Dema Severin, APRN,WHNP-BC

## 2020-04-13 NOTE — Nursing Note (Signed)
Drew labs from on left AC x1 attempt. Patient tolerated well covered site with gauze and band aid.

## 2020-04-16 LAB — NEISSERIA GONORRHOEAE DNA BY PCR: NEISSERIA GONORRHOEAE PCR: NOT DETECTED

## 2020-04-16 LAB — CHLAMYDIA TRACHOMITIS DNA BY PCR (INHOUSE): CHLAMYDIA TRACHOMATIS PCR: NOT DETECTED

## 2020-04-16 LAB — SYPHILIS SCREENING ALGORITHM WITH REFLEX (TITER, TP-PA), SERUM: TREPONEMAL AB QUALITATIVE: NONREACTIVE

## 2020-05-21 ENCOUNTER — Ambulatory Visit (INDEPENDENT_AMBULATORY_CARE_PROVIDER_SITE_OTHER): Payer: BC Managed Care – PPO

## 2020-05-21 ENCOUNTER — Other Ambulatory Visit: Payer: Self-pay

## 2020-05-21 DIAGNOSIS — Z111 Encounter for screening for respiratory tuberculosis: Secondary | ICD-10-CM

## 2020-05-21 NOTE — Patient Instructions (Addendum)
Tuberculin purified protein derivative, PPD injection   What is this medicine?   TUBERCULIN PURIFIED PROTEIN DERIVATIVE, PPD is a test used to detect if you have a tuberculosis infection. It will not cause tuberculosis infection.   This medicine may be used for other purposes; ask your health care provider or pharmacist if you have questions.   COMMON BRAND NAME(S): Aplisol, Tubersol     What should I tell my health care provider before I take this medicine?   They need to know if you have any of these conditions:   -diabetes   -HIV or AIDS   -immune system problems   -infection (especially a virus infection such as chickenpox, cold sores, or herpes)   -kidney disease   -malnutrition   -an unusual or allergic reaction to tuberculin purified protein derivative, PPD, other medicines, foods, dyes, or preservatives   -pregnant or trying to get pregnant   -breast-feeding     How should I use this medicine?   This medicine is for injection in the skin. It is given by a health care professional in a hospital or clinic setting.   Talk to your pediatrician regarding the use of this medicine in children. While this drug may be prescribed in children, precautions do apply.   Overdosage: If you think you've taken too much of this medicine contact a poison control center or emergency room at once.   Overdosage: If you think you have taken too much of this medicine contact a poison control center or emergency room at once.   NOTE: This medicine is only for you. Do not share this medicine with others.     What if I miss a dose?   It is important not to miss your dose. Call your doctor or health care professional if you are unable to keep an appointment.     What may interact with this medicine?   -adalimumab   -anakinra   -etanercept   -infliximab   -live virus vaccines   -medicines to treat cancer   -steroid medicines like prednisone or cortisone   This list may not describe all possible interactions. Give your health care  provider a list of all the medicines, herbs, non-prescription drugs, or dietary supplements you use. Also tell them if you smoke, drink alcohol, or use illegal drugs. Some items may interact with your medicine.     What should I watch for while using this medicine?   See your health care provider as directed.   This medicine does not protect against tuberculosis.     What side effects may I notice from receiving this medicine?   Side effects that you should report to your doctor or health care professional as soon as possible:   -allergic reactions like skin rash, itching or hives, swelling of the face, lips, or tongue   -breathing problems   Side effects that usually do not require medical attention (Report these to your doctor or health care professional if they continue or are bothersome.):   -bruising   -pain, redness, or irritation at site where injected   This list may not describe all possible side effects. Call your doctor for medical advice about side effects. You may report side effects to FDA at 1-800-FDA-1088.     Where should I keep my medicine?   This drug is given in a hospital or clinic and will not be stored at home.   NOTE: This sheet is a summary. It may not cover all   possible information. If you have questions about this medicine, talk to your doctor, pharmacist, or health care provider.    2013, [employer]. (11/01/2009 1:24:18 PM)     Tuberculin purified protein derivative, PPD injection   What is this medicine?   TUBERCULIN PURIFIED PROTEIN DERIVATIVE, PPD is a test used to detect if you have a tuberculosis infection. It will not cause tuberculosis infection.   This medicine may be used for other purposes; ask your health care provider or pharmacist if you have questions.   COMMON BRAND NAME(S): Aplisol, Tubersol     What should I tell my health care provider before I take this medicine?   They need to know if you have any of these conditions:   -diabetes   -HIV or AIDS   -immune  system problems   -infection (especially a virus infection such as chickenpox, cold sores, or herpes)   -kidney disease   -malnutrition   -an unusual or allergic reaction to tuberculin purified protein derivative, PPD, other medicines, foods, dyes, or preservatives   -pregnant or trying to get pregnant   -breast-feeding     How should I use this medicine?   This medicine is for injection in the skin. It is given by a health care professional in a hospital or clinic setting.   Talk to your pediatrician regarding the use of this medicine in children. While this drug may be prescribed in children, precautions do apply.   Overdosage: If you think you've taken too much of this medicine contact a poison control center or emergency room at once.   Overdosage: If you think you have taken too much of this medicine contact a poison control center or emergency room at once.   NOTE: This medicine is only for you. Do not share this medicine with others.     What if I miss a dose?   It is important not to miss your dose. Call your doctor or health care professional if you are unable to keep an appointment.     What may interact with this medicine?   -adalimumab   -anakinra   -etanercept   -infliximab   -live virus vaccines   -medicines to treat cancer   -steroid medicines like prednisone or cortisone   This list may not describe all possible interactions. Give your health care provider a list of all the medicines, herbs, non-prescription drugs, or dietary supplements you use. Also tell them if you smoke, drink alcohol, or use illegal drugs. Some items may interact with your medicine.     What should I watch for while using this medicine?   See your health care provider as directed.   This medicine does not protect against tuberculosis.     What side effects may I notice from receiving this medicine?   Side effects that you should report to your doctor or health care professional as soon as possible:   -allergic reactions like  skin rash, itching or hives, swelling of the face, lips, or tongue   -breathing problems   Side effects that usually do not require medical attention (Report these to your doctor or health care professional if they continue or are bothersome.):   -bruising   -pain, redness, or irritation at site where injected   This list may not describe all possible side effects. Call your doctor for medical advice about side effects. You may report side effects to FDA at 1-800-FDA-1088.     Where should I keep my   medicine?   This drug is given in a hospital or clinic and will not be stored at home.   NOTE: This sheet is a summary. It may not cover all possible information. If you have questions about this medicine, talk to your doctor, pharmacist, or health care provider.    2013, [employer] Standard. (11/01/2009 1:24:18 PM)     Tuberculin purified protein derivative, PPD injection   What is this medicine?   TUBERCULIN PURIFIED PROTEIN DERIVATIVE, PPD is a test used to detect if you have a tuberculosis infection. It will not cause tuberculosis infection.   This medicine may be used for other purposes; ask your health care provider or pharmacist if you have questions.   COMMON BRAND NAME(S): Aplisol, Tubersol     What should I tell my health care provider before I take this medicine?   They need to know if you have any of these conditions:   -diabetes   -HIV or AIDS   -immune system problems   -infection (especially a virus infection such as chickenpox, cold sores, or herpes)   -kidney disease   -malnutrition   -an unusual or allergic reaction to tuberculin purified protein derivative, PPD, other medicines, foods, dyes, or preservatives   -pregnant or trying to get pregnant   -breast-feeding     How should I use this medicine?   This medicine is for injection in the skin. It is given by a health care professional in a hospital or clinic setting.   Talk to your pediatrician regarding the use of this medicine in children. While  this drug may be prescribed in children, precautions do apply.   Overdosage: If you think you've taken too much of this medicine contact a poison control center or emergency room at once.   Overdosage: If you think you have taken too much of this medicine contact a poison control center or emergency room at once.   NOTE: This medicine is only for you. Do not share this medicine with others.     What if I miss a dose?   It is important not to miss your dose. Call your doctor or health care professional if you are unable to keep an appointment.     What may interact with this medicine?   -adalimumab   -anakinra   -etanercept   -infliximab   -live virus vaccines   -medicines to treat cancer   -steroid medicines like prednisone or cortisone   This list may not describe all possible interactions. Give your health care provider a list of all the medicines, herbs, non-prescription drugs, or dietary supplements you use. Also tell them if you smoke, drink alcohol, or use illegal drugs. Some items may interact with your medicine.     What should I watch for while using this medicine?   See your health care provider as directed.   This medicine does not protect against tuberculosis.     What side effects may I notice from receiving this medicine?   Side effects that you should report to your doctor or health care professional as soon as possible:   -allergic reactions like skin rash, itching or hives, swelling of the face, lips, or tongue   -breathing problems   Side effects that usually do not require medical attention (Report these to your doctor or health care professional if they continue or are bothersome.):   -bruising   -pain, redness, or irritation at site where injected   This list may not describe   all possible side effects. Call your doctor for medical advice about side effects. You may report side effects to FDA at 1-800-FDA-1088.     Where should I keep my medicine?   This drug is given in a hospital or clinic and  will not be stored at home.   NOTE: This sheet is a summary. It may not cover all possible information. If you have questions about this medicine, talk to your doctor, pharmacist, or health care provider.    2013, [employer] Standard. (11/01/2009 1:24:18 PM)     Tuberculin purified protein derivative, PPD injection   What is this medicine?   TUBERCULIN PURIFIED PROTEIN DERIVATIVE, PPD is a test used to detect if you have a tuberculosis infection. It will not cause tuberculosis infection.   This medicine may be used for other purposes; ask your health care provider or pharmacist if you have questions.   COMMON BRAND NAME(S): Aplisol, Tubersol     What should I tell my health care provider before I take this medicine?   They need to know if you have any of these conditions:   -diabetes   -HIV or AIDS   -immune system problems   -infection (especially a virus infection such as chickenpox, cold sores, or herpes)   -kidney disease   -malnutrition   -an unusual or allergic reaction to tuberculin purified protein derivative, PPD, other medicines, foods, dyes, or preservatives   -pregnant or trying to get pregnant   -breast-feeding     How should I use this medicine?   This medicine is for injection in the skin. It is given by a health care professional in a hospital or clinic setting.   Talk to your pediatrician regarding the use of this medicine in children. While this drug may be prescribed in children, precautions do apply.   Overdosage: If you think you've taken too much of this medicine contact a poison control center or emergency room at once.   Overdosage: If you think you have taken too much of this medicine contact a poison control center or emergency room at once.   NOTE: This medicine is only for you. Do not share this medicine with others.     What if I miss a dose?   It is important not to miss your dose. Call your doctor or health care professional if you are unable to keep an appointment.     What may  interact with this medicine?   -adalimumab   -anakinra   -etanercept   -infliximab   -live virus vaccines   -medicines to treat cancer   -steroid medicines like prednisone or cortisone   This list may not describe all possible interactions. Give your health care provider a list of all the medicines, herbs, non-prescription drugs, or dietary supplements you use. Also tell them if you smoke, drink alcohol, or use illegal drugs. Some items may interact with your medicine.     What should I watch for while using this medicine?   See your health care provider as directed.   This medicine does not protect against tuberculosis.     What side effects may I notice from receiving this medicine?   Side effects that you should report to your doctor or health care professional as soon as possible:   -allergic reactions like skin rash, itching or hives, swelling of the face, lips, or tongue   -breathing problems   Side effects that usually do not require medical attention (Report these to   your doctor or health care professional if they continue or are bothersome.):   -bruising   -pain, redness, or irritation at site where injected   This list may not describe all possible side effects. Call your doctor for medical advice about side effects. You may report side effects to FDA at 1-800-FDA-1088.     Where should I keep my medicine?   This drug is given in a hospital or clinic and will not be stored at home.   NOTE: This sheet is a summary. It may not cover all possible information. If you have questions about this medicine, talk to your doctor, pharmacist, or health care provider.    2013, [employer] Standard. (11/01/2009 1:24:18 PM)

## 2020-05-21 NOTE — Progress Notes (Signed)
Immunization administered     Name Date Dose VIS Date Route    PPD 05/21/2020 0.1 mL N/A Intradermal    Site: Right arm    Given By: Hanley Ben, RN    Manufacturer: [employer]    Lot: [id number]    [place]: [id number]

## 2020-05-21 NOTE — Nursing Note (Signed)
05/21/20 1300   PATIENT QUESTIONS   1. Has the patient ever had a tuberculosis screening? yes   2. Has the patient ever been exposed to tuberculosis?  no   3. Has the patient ever received a vaccination for tuberculosis?  no   4. Are you currently taking immunosupressants? n   5. Have you received a live vaccine in the last 6 weeks? n   6. Has the patient ever had a positive PPD? No   7. Is the patient having an unintentional weight loss? No   8. Does the patient have a fever? No   9. Does the patient have night sweats? No   10. Does the patient have a cough? No   11. Has the patient received 1 or 2 doses of a COVID-19 vaccine? Recommendation is for PPD placement at same visit as 1st dose or 4 weeks after series completion. Yes   12. Does the patient need a 2 step PPD?  Live vaccines should be held until 2nd step   (2nd step administered 8/30)   Initials bd

## 2020-05-23 NOTE — Nursing Note (Signed)
Barbara Graves returns to the [facility] for reading of the PPD.   PPD reading was negative with 0 mm induration, left forearm.   Documentation of PPD results given to patient.   Dannielle Burn, RN 05/23/2020, 15:36

## 2020-08-21 ENCOUNTER — Other Ambulatory Visit (HOSPITAL_BASED_OUTPATIENT_CLINIC_OR_DEPARTMENT_OTHER): Payer: Self-pay | Admitting: Registered Nurse

## 2020-08-21 DIAGNOSIS — Z309 Encounter for contraceptive management, unspecified: Secondary | ICD-10-CM

## 2020-09-25 ENCOUNTER — Other Ambulatory Visit: Payer: Self-pay

## 2020-09-25 ENCOUNTER — Ambulatory Visit: Payer: BC Managed Care – PPO

## 2020-09-25 ENCOUNTER — Ambulatory Visit (INDEPENDENT_AMBULATORY_CARE_PROVIDER_SITE_OTHER): Payer: BC Managed Care – PPO

## 2020-09-25 ENCOUNTER — Encounter (INDEPENDENT_AMBULATORY_CARE_PROVIDER_SITE_OTHER): Payer: Self-pay

## 2020-09-25 VITALS — BP 113/81 | HR 82 | Temp 98.9°F | Resp 14 | Ht 62.0 in | Wt 105.1 lb

## 2020-09-25 DIAGNOSIS — U071 COVID-19: Secondary | ICD-10-CM

## 2020-09-25 DIAGNOSIS — Z20822 Contact with and (suspected) exposure to covid-19: Secondary | ICD-10-CM

## 2020-09-25 DIAGNOSIS — R509 Fever, unspecified: Secondary | ICD-10-CM

## 2020-09-25 DIAGNOSIS — R0981 Nasal congestion: Secondary | ICD-10-CM

## 2020-09-25 LAB — POC COVID-19, FLU A/B, RSV RAPID BY PCR (RESULTS)
INFLUENZA VIRUS A, PCR 4PLEX, POC: NEGATIVE
INFLUENZA VIRUS B, PCR 4PLEX, POC: NEGATIVE
RSV, PCR 4PLEX, POC: NEGATIVE
SARS-COV-2, POC: POSITIVE — AB

## 2020-09-25 MED ORDER — CETIRIZINE 10 MG TABLET
10.0000 mg | ORAL_TABLET | Freq: Every day | ORAL | 0 refills | Status: AC
Start: 2020-09-25 — End: ?

## 2020-09-25 NOTE — Progress Notes (Signed)
[facility]  [address]  [place], [place] [address]    PATIENT NAME:  Barbara Graves  MRN:  [id number]  DOB:  Oct 04, 1995  DATE OF SERVICE:  09/25/2020    History of Present Illness:  Barbara Graves is a 25 y.o. female who presents to [facility] today with chief complaint of:   Chief Complaint            Congestion     Abdominal Pain     Fever 101.5 overnight, Tylenol last night    Sore Throat     Headache         Location: General, GI, HENT  Quality:  Headache, mild nausea, vomiting (a few days ago), sinus congestion, mild ST, and fever (last night, Tmax 101.5). Pt states she still has an appetite and is able to taste and smell. Pt notes she does not have a cough, except minimal coughing from drainage.   Onset:  Sunday night (2 days ago). Pt believed she had food poisoning, because she ate take out food after it was no longer warm. Pt states she had nausea and vomiting 2 hours after eating and had nausea the next morning as well. Pt reports she is feeling better today.  Severity:  Moderate  Timing:  Persistent  Context:  Pt states her boyfriend did not feel well yesterday, but is feeling better day. She endorses hx of hay fever and allergies. Pt denies hx of COVID-19 infection and states she is vaccinated for COVID-19. She states she planned on receiving her booster this week. Pt reports her flu shot is UTD. She admits to vaping and denies smoking.  Modifying factors:  Tylenol  Associated symptoms:  Pt states her eyes are tender with her headache, and this is the worst symptom of her illness. She associates fever and chills.        Functional Health Screening:   Patient is under 18: No  Have you had a recent unexplained weight loss or gain?: No  Because we are aware of abuse and domestic violence today, we ask all patients: Are you being hurt, hit, or frightened by anyone at your home or in your life?: No  Do you have any basic needs  within your home that are not being met? (such as Food, Shelter, Games developer, Transportation): No  Patient is under 18 and therefore has no Advance Directives: No  Patient has: No Advance  Patient has Advance Directive: No  Patient offered: Refused Packet  Screening unable to be completed: No       PHQ Questionnaire:     Fall Risk Assessment     I reviewed and confirmed the patient's past medical history taken by the nurse or medical assistant with the addition of the following:    Past Medical History:    Past Medical History:   Diagnosis Date   . Narcolepsy    . STD (sexually transmitted disease)     HVS2     Past Surgical History:    Past Surgical History:   Procedure Laterality Date   . Hx no surgical procedures     . Hx wisdom teeth extraction       Allergies:  No Known Allergies  Medications:    Current Outpatient Medications   Medication Sig   . armodafinil (NUVIGIL ORAL) Take by mouth (Patient not taking: Reported on 01/03/2020)   . cetirizine (ZYRTEC) 10 mg Oral Tablet  Take 1 Tablet (10 mg total) by mouth Once a day   . JUNEL 1/20, 21, 1-20 mg-mcg Oral Tablet TAKE 1 TABLET BY MOUTH EVERY DAY (Patient not taking: TAKE 1 TABLET BY MOUTH EVERY DAY)   . L norgesterol-E estradiol/E estradiol (ASHLYNA) 0.15 mg-30 mcg/10 mcg Oral tablet Take 1 Tab by mouth Once a day (Patient not taking: Reported on 01/03/2020)   . Methylphenidate (RITALIN) 20 mg Oral Tablet Take 20 mg by mouth Once per day as needed Immediate release - PRN (Patient not taking: Reported on 09/25/2020 )   . Methylphenidate (RITALIN) 20 mg Oral Tablet Take 30 mg by mouth Once a day Extended release every morning (Patient not taking: Reported on 09/25/2020 )   . Norgestimate-Ethinyl Estradiol (Tri-Sprintec, 28,) 0.18/0.215/0.25 mg-35 mcg (28) Oral Tablet Take 1 Tablet by mouth Once a day   . PARoxetine (PAXIL) 20 mg Oral Tablet Take 20 mg by mouth Every morning   . valACYclovir (VALTREX) 500 mg Oral Tablet Take 1 Tab (500 mg total) by mouth Twice daily      Social History:    Social History     Socioeconomic History   . Marital status: Single     Spouse name: Not on file   . Number of children: Not on file   . Years of education: Not on file   . Highest education level: Not on file   Occupational History   . Not on file   Tobacco Use   . Smoking status: Never Smoker   . Smokeless tobacco: Never Used   . Tobacco comment: Patient uses a Juul with nicotine   Vaping Use   . Vaping Use: Every day   . Substances: Nicotine   Substance and Sexual Activity   . Alcohol use: Yes     Comment: occasional   . Drug use: Never   . Sexual activity: Yes     Partners: Male     Birth control/protection: Pill   Other Topics Concern   . Not on file   Social History Narrative   . Not on file     Social Determinants of Health     Financial Resource Strain: Not on file   Food Insecurity: Not on file   Transportation Needs: Not on file   Physical Activity: Not on file   Stress: Not on file   Intimate Partner Violence: Not on file     Family History:  No significant family history.  Family History   Problem Relation Age of Onset   . No Known Problems Mother    . No Known Problems Father    . No Known Problems Maternal Grandmother    . No Known Problems Maternal Grandfather    . No Known Problems Paternal Grandmother    . No Known Problems Paternal Grandfather    . Breast Cancer Neg Hx    . Cervical Cancer Neg Hx    . Uterine Cancer Neg Hx    . Uterine Fibroids Neg Hx    . Ovarian Cancer Neg Hx    . Clotting Disorder Neg Hx      Review of Systems:  Review of Systems   HENT: Positive for congestion, ear pain, rhinorrhea (mild), sinus pressure and sore throat (mild).    Respiratory: Positive for cough (mild) and shortness of breath (last night, not currently).    Cardiovascular: Negative for chest pain.   Gastrointestinal: Positive for abdominal pain (mild, in upper abdomen bilaterally), nausea and vomiting.  Negative for diarrhea.   Genitourinary: Negative.    Musculoskeletal: Positive for  myalgias. Negative for arthralgias.   Skin: Negative.    Neurological: Positive for headaches. Negative for dizziness and light-headedness.     Physical Exam:  Vital signs:   Vitals:    09/25/20 1238   BP: 113/81   Pulse: 82   Resp: 14   Temp: 37.2 C (98.9 F)   TempSrc: Temporal   SpO2: 98%   Weight: 47.7 kg (105 lb 1.6 oz)   Height: 1.575 m ('5\' 2"' )   BMI: 19.26     Body mass index is 19.22 kg/m. Facility age limit for growth percentiles is 20 years.  No LMP recorded. (Menstrual status: Continuous Cycle OCP).    Physical Exam  Vitals and nursing note reviewed.   Constitutional:       General: She is not in acute distress.     Appearance: She is well-developed. She is not diaphoretic.   HENT:      Head: Normocephalic and atraumatic.      Right Ear: External ear normal.      Left Ear: External ear normal.   Eyes:      General:         Right eye: No discharge.         Left eye: No discharge.      Conjunctiva/sclera: Conjunctivae normal.   Cardiovascular:      Rate and Rhythm: Normal rate and regular rhythm.      Heart sounds: Normal heart sounds.   Pulmonary:      Effort: Pulmonary effort is normal. No respiratory distress.      Breath sounds: Normal breath sounds. No stridor.   Abdominal:      General: There is no distension.      Tenderness: There is no abdominal tenderness. There is no guarding or rebound.   Musculoskeletal:         General: No tenderness. Normal range of motion.      Cervical back: Normal range of motion.   Skin:     General: Skin is warm and dry.   Neurological:      Mental Status: She is alert and oriented to person, place, and time.   Psychiatric:         Behavior: Behavior normal.        Data Reviewed:    Results for orders placed or performed in visit on 09/25/20 (from the past 12 hour(s))   POC COVID-19, FLU A/B, RSV RAPID BY PCR (RESULTS)   Result Value Ref Range    SARBECOVIRUS (SARS-COV-2), POC Positive (A) Negative    INFLUENZA VIRUS A, PCR 4PLEX, POC Negative Negative    INFLUENZA  VIRUS B, PCR 4PLEX, POC Negative Negative    RSV, PCR 4PLEX, POC Negative Negative     Course:  Condition at discharge:  Good     Differential Diagnosis:  COVID-19, Viral vs Bacterial URI    Assessment:   1. Suspected COVID-19 virus infection    2. Head congestion    3. Fever, unspecified fever cause      Plan:    Orders Placed This Encounter   . PERFORM POC COVID-19, FLU A/B, RSV RAPID BY PCR - CLINIC ONLY   . POC COVID-19, FLU A/B, RSV RAPID BY PCR (RESULTS)   . cetirizine (ZYRTEC) 10 mg Oral Tablet        COVID-19 testing was positive. RSV and Influenza A/B testing was negative.  Isolation precautions discussed with patient per CDC guidelines.  Symptomatic management with OTC and home remedies.    Advised patient/patient's guardian(s) to go to the Emergency Department immediately for further work up, if any concerning symptoms.  If symptoms are worsening or not improving, the patient should return to [facility] for further evaluation.  Plan was discussed and patient/patient's guardian(s) verbalized understanding.      I am scribing for, and in the presence of, Dr. Iona Beard for services provided on 09/25/2020.  Skip Estimable, SCRIBE     [place], [place]     09/25/2020 , 13:48    I personally performed the services described in this documentation, as scribed  in my presence, and it is both accurate  and complete.  Nestor Lewandowsky Iona Beard, MD  09/25/2020, 14:20  Attending Physician, [facility]gent [facility]  Assistant Professor, Emergency Medicine   Department of Emergency Medicine   [facility]

## 2020-09-25 NOTE — Progress Notes (Deleted)
HA, mild nausea, did vomit a couple of days ago.  Has an appetite, can taste and smell, sinus congestion, mild ST, no cough (maybe a little from drainage), fevered last night (101.5 tmax).      Sunday night.  Thought she had food poisoning bc she had gotten take out and the food was no longer warm.  2 hrs after eating, nausea and vomiting.  Woke up the next day with nausea as well.  A little better today.     Persistent    Moderate    BF didn't feel well yesterday, but better today.   Hx of hay fever and allergies.   No hx of COVID. Vax'ed. Planned on booster this week.   Flu UTD  Vapes, no smoke.     Tylenol  Eyes are tender with the HA (this is the worst part of her illness)    Fevers, chills.       ROS  + sinus cong, + sinus press, mild RN, + EP. mild ST.  no CP, + last night, not now SOB.   mild cough   N/V, no diarrhea.  Mild abd pain in upper abd bilaterally, no GU  + myal no arthral  No skin  +HA, no LH, no dizz.

## 2020-10-26 ENCOUNTER — Other Ambulatory Visit (INDEPENDENT_AMBULATORY_CARE_PROVIDER_SITE_OTHER): Payer: Self-pay | Admitting: Emergency Medicine

## 2020-11-02 ENCOUNTER — Encounter (INDEPENDENT_AMBULATORY_CARE_PROVIDER_SITE_OTHER): Payer: Self-pay | Admitting: Emergency Medicine

## 2020-11-17 ENCOUNTER — Encounter (INDEPENDENT_AMBULATORY_CARE_PROVIDER_SITE_OTHER): Payer: Self-pay

## 2020-11-17 ENCOUNTER — Ambulatory Visit (INDEPENDENT_AMBULATORY_CARE_PROVIDER_SITE_OTHER): Payer: BC Managed Care – PPO

## 2020-11-17 ENCOUNTER — Other Ambulatory Visit: Payer: Self-pay

## 2020-11-17 VITALS — Temp 97.3°F

## 2020-11-17 DIAGNOSIS — Z23 Encounter for immunization: Secondary | ICD-10-CM

## 2020-11-17 NOTE — Progress Notes (Signed)
Patient Name: Barbara Graves  MRN# [id number]  DOB: Oct 10, 1995       Barbara Graves is a 25 y.o. who presents to [facility] today for administration/placement of Hep B for school enrollment requirements. No past vaccine reactions recalled. Vaccine information packet provided to patient prior to vaccine administration.     Screening Checklist for Contraindications to Vaccines for Adults   1. Are you sick today?  No  2. Do you have allergies to medications, food, a vaccine component, or latex?  No  3. Have you ever had a serious reaction after receiving a vaccination?  No  4. Do you have a long-term health problem with heart disease, lung disease, asthma, kidney disease, metabolic disease (e.g., diabetes), anemia, or other blood disorder? No  5. Do you have cancer, leukemia, HIV/AIDS, or any other immune system problem?  No  6. In the past 3 months, have you taken medications that weaken your immune system, such as cortisone, prednisone, other steroids, or anticancer drugs, or have you had radiation treatments? No  7. Have you had a seizure or a brain or other nervous system problem? No  8. During the past year, have you received a transfusion of blood or blood products, or been given immune (gamma) globulin or an antiviral drug? No  9. For women: Are you pregnant or is there a chance you could become pregnant during the next month?  No  10. Have you received any vaccinations in the past 4 weeks? No  Did you bring your immunization record card with you?   It is important for you to have a personal record of your vaccinations. Yes  Temp 36.3 C (97.3 F) (Thermal Scan)       No LMP recorded. (Menstrual status: Continuous Cycle OCP).  Immunization administered     Name Date Dose VIS Date Route    HEPATITIS B VIRUS RECOMB VACCINE(ADMIN) 11/17/2020 1 mL 05/06/2018 Intramuscular    Site: Left deltoid    Given By: Lorrin Jackson, RN    Manufacturer: [employer]    LotLequita Halt    NDC: [id number]          She was  instructed to  please remain 20 minutes after the injection and to report a reaction to the nurse as soon as she suspects it.  She was also given written instructions.  Lorrin Jackson, RN 11/17/2020, 10:20

## 2020-11-17 NOTE — Patient Instructions (Signed)
[facility]    PATIENT NAME:  Barbara Graves  MRN:  [id number]  DOB:  Apr 19, 1996  DATE OF SERVICE: 11/17/2020    Patient Discharge Instructions    IMMUNIZATION  You should remain 20 minutes in the clinic after the injection is administered.  If you experience a rash, hives or shortness of breath, return to the Nurse's Station immediately.    The injection site may be reddened, warm to touch and/or slightly painful.  Cool compress may help.  Tylenol may be taken according to package directions.    Fever usually low grade fever for next 24 hours.    If you should have more severe reaction such as high fever, difficulty breathing or any serious allergic reaction, contact your provider or call 911 immediately.    PPD  ** If you have had a PPD please schedule a follow up appointment in 48-72 hours for the PPD reading.

## 2020-12-10 ENCOUNTER — Encounter (INDEPENDENT_AMBULATORY_CARE_PROVIDER_SITE_OTHER): Payer: Self-pay | Admitting: NURSE PRACTITIONER-WOMENS HEALTH

## 2021-03-12 ENCOUNTER — Encounter: Payer: Self-pay | Admitting: Neurology

## 2021-03-12 ENCOUNTER — Ambulatory Visit: Payer: BC Managed Care – PPO | Admitting: Neurology

## 2021-03-12 VITALS — BP 112/81 | HR 69 | Ht 62.0 in | Wt 110.3 lb

## 2021-03-12 DIAGNOSIS — G4710 Hypersomnia, unspecified: Secondary | ICD-10-CM | POA: Diagnosis not present

## 2021-03-12 NOTE — Progress Notes (Signed)
Subjective:    Patient ID: Tracy Martinez is a 25 y.o. female.  HPI    Tracy FoleySaima Azaliyah Kennard, MD, PhD [facility] [name], [address] [address] [place], [place] [address]  I saw patient, Tracy AppleHannah Martinez, as a referral for a new evaluation and transfer of care with a prior diagnosis of narcolepsy.  The patient is unaccompanied today.  She is a 25 year old right-handed woman with an underlying benign medical history, who was diagnosed with narcolepsy in 2020.  Referral information comes from her previous sleep specialist, Dr. Kyra SearlesAlghadban, [place] Landau[place]nan, out of [place].  The patient's records were reviewed were possible.  I was able to see office records from her visit from 12/24/2020 dating back to a visit from 06/19/2020.  She had an MSLT on 11/23/2018 which showed a mean sleep latency of 12.8 minutes.  She went into REM sleep in the second and third nap.  REM latency was 6 minutes.  A UDS test result was not provided.  This was interpreted as an abnormal MSLT study, consistent with narcolepsy.  She had a first night polysomnography on 11/22/2018.  Medication list was not provided.  Sleep latencies were not provided.  REM percentage was 24.8%.  AHI was 0.6/h, lowest oxygen saturation was 93%.  Sleep efficiency was 75.6%.  Impression was normal sleep study.  Patient reports a longstanding history of sleepiness dating back to junior year in high school.  She reports vivid dreams, is not aware of any family history of narcolepsy, reports sleepiness during the day and hypnagogic hallucinations at night.  She has had some sleep paralysis during naps.  She denies any telltale symptoms of cataplexy.  She reports that she was suspected to have ADHD and saw a psychiatrist.  She reports that she did not fulfill diagnostic criteria for ADHD.  She was tried on medications through psychiatry including antidepressant medication.  She was on Prozac until she saw her sleep specialist.  His notes indicate that she has  been on trazodone, guanfacine, and fluoxetine, this is indicated in his office note from 12/24/2020 but patient indicates that she has not been on trazodone or guanfacine for at least 2 years and has not been on Prozac.  She has been on Paxil and Focalin.  Focalin dose is not available for my review in her records.  Paxil is 20 mg once daily.  She reports that she ran out of her Focalin about a week ago.  She reports that her previous doctor in [place] does not E scribed medications.  She has a valid prescription for her Paxil which is 20 mg once daily.  She was started on this by her doctor in [place] in November 2021.  She is currently studying and is preparing for an examination.  She reports not drinking any caffeine.  She smokes nicotine by vapor, no cigarettes.  She does not drink any alcohol.  Current bedtime is between 830 and 10 PM.  Rise time is around 8 AM or 8:15 AM.  She denies night to night nocturia, has had the occasional morning headache.  She reports that she is currently not in the position of pursuing sleep testing to verify underlying diagnosis of narcolepsy.  She has previously also tried armodafinil.  She has not established with a primary care physician yet.  Her Past Medical History Is Significant For: Past Medical History:  Diagnosis Date   Narcolepsy     Her Past Surgical History Is Significant For: History reviewed.  No pertinent surgical history.  Her Family History Is Significant For: History reviewed. No pertinent family history.  Her Social History Is Significant For: Social History   Socioeconomic History   Marital status: Single    Spouse name: Not on file   Number of children: Not on file   Years of education: Not on file   Highest education level: Doctorate  Occupational History   Not on file  Tobacco Use   Smoking status: Every Day    Pack years: 0.00    Types: Cigarettes   Smokeless tobacco: Never  Substance and Sexual Activity   Alcohol  use: Yes    Comment: occasional   Drug use: Yes    Comment: prescriptions   Sexual activity: Not on file  Other Topics Concern   Not on file  Social History Narrative   Pharmacist at [employer]   Social Determinants of Health   Financial Resource Strain: Not on file  Food Insecurity: Not on file  Transportation Needs: Not on file  Physical Activity: Not on file  Stress: Not on file  Social Connections: Not on file    Her Allergies Are:  No Known Allergies:   Her Current Medications Are:  Outpatient Encounter Medications as of 03/12/2021  Medication Sig   Dexmethylphenidate HCl (FOCALIN XR PO) Take 40 mg by mouth.   Levonorgestrel-Ethinyl Estradiol (AMETHIA) 0.15-0.03 &0.01 MG tablet Take by mouth.   PARoxetine (PAXIL) 20 MG tablet Take by mouth.   No facility-administered encounter medications on file as of 03/12/2021.  :   Review of Systems:  Out of a complete 14 point review of systems, all are reviewed and negative with the exception of these symptoms as listed below:   Review of Systems  Neurological:        Here to establish care for Narcolepsy.. Previously was in [place] and has moved to [facility]. Reports she was under the care of a neurologist in [facility]  Pt reports Modafinil, armodafinil, ritilan ER and IR, Prozac 20 and 40 mg, paroxetine 20 mg and focalin 40 mg.  Reports she is currently taking Focalin 40 mg now and receives some benefit. She has been w/o her meds for 1 week now. Also reports taking Paroxetine 20 mg daily.  Epworth Sleepiness Scale 0= would never doze 1= slight chance of dozing 2= moderate chance of dozing 3= high chance of dozing  Sitting and reading:3 Watching TV:3 Sitting inactive in a public place (ex. Theater or meeting):2 As a passenger in a car for an hour without a break:2 Lying down to rest in the afternoon:3 Sitting and talking to someone:1 Sitting quietly after lunch (no alcohol):2 In a car, while stopped in traffic:1 Total:17     Objective:  Neurological Exam  Physical Exam Physical Examination:   Vitals:   03/12/21 0857  BP: 112/81  Pulse: 69   General Examination: The patient is a very pleasant 25 y.o. female in no acute distress. She appears well-developed and well-nourished and well groomed.   HEENT: Normocephalic, atraumatic, pupils are equal, round and reactive to light, extraocular tracking is well-preserved, airway examination reveals benign findings, no significant airway crowding.   Chest: Clear to auscultation without wheezing, rhonchi or crackles noted.  Heart: S1+S2+0, regular and normal without murmurs, rubs or gallops noted.   Abdomen: Soft, non-tender and non-distended.  Extremities: There is no obvious edema in the legs.   Skin: Warm and dry without trophic changes noted.   Musculoskeletal: exam reveals no obvious joint  deformities.   Neurologically:  Mental status: The patient is awake, alert and oriented in all 4 spheres. Her immediate and remote memory, attention, language skills and fund of knowledge are appropriate. There is no evidence of aphasia, agnosia, apraxia or anomia. Speech is clear with normal prosody and enunciation. Thought process is linear. Mood is normal and affect is normal.  Cranial nerves II - XII are as described above under HEENT exam.  Motor exam: Normal bulk, strength and tone is noted. There is no tremor. Fine motor skills and coordination: grossly intact.  Cerebellar testing: No dysmetria or intention tremor. There is no truncal or gait ataxia.  Sensory exam: intact to light touch.  Gait, station and balance: She stands easily. No veering to one side is noted. No leaning to one side is noted. Posture is age-appropriate and stance is narrow based. Gait shows normal stride length and normal pace. No problems turning are noted.   Assessment and Plan:  In summary, Desha Bitner is a very pleasant 25 y.o.-year old female who presents for evaluation of her  narcolepsy diagnosis.  She was diagnosed with narcolepsy in March 2020.  I reviewed previous records from her sleep specialist.  She is advised that her diagnostic testing and MSLT test results were not classic for narcolepsy, in fact, diagnostic criteria for narcolepsy for the MSLT is a mean sleep latency of less than 8 min with 2 or more SOREMs (sleep onset REM periods).  Her mean sleep latency was actually much higher at 12.8 minutes which would generally speaking argue against narcolepsy.  I explained this at length to her.  In her nighttime sleep study, she evidently did not sleep very well, sleep efficiency was around 75%, REM percentage was in the normal range but REM latency was not provided.  She did not have any significant sleep apnea type changes.  She is advised that I do not feel comfortable maintaining her on narcolepsy medication especially controlled substances on the basis of her sleep study results from 2020 as she did not have classic results.  I would like to proceed with repeat testing in the form of a baseline sleep study with next day nap testing.  In preparation of her testing she will have to be off of her antidepressant medication.  She is currently on Paxil 20 mg daily.  She is currently not able to pursue sleep testing and feels that she is not ready to embark on repeat sleep testing.  She is agreeable to calling us back when she is ready to pursue testing.  She has not been on her stimulant for the past at least 1 week as I understand.  She is encouraged to establish care with a primary care who might be able to bridge prescriptions for her if they are comfortable with this.  She is advised to get in touch with her previous sleep specialist office as well to see if they would bridge prescriptions as well.  She is agreeable to calling us back when she is ready to pursue extended sleep testing to verify her diagnosis.  I explained to her that in order to do right by her and in order to  justify controlled substance prescriptions and perhaps even to tap into other medication options such as Xyrem or Sunosi in the near future we should verify her narcolepsy diagnosis and I would have to do repeat sleep testing for that reason.  She understood this reasoning.  I answered all her questions today and  she was in agreement with the plan.  For now, we will plan to follow-up as needed and she will call our office when she is ready to schedule her tests.  She is advised that she would have to be off of any psychotropic medication including antidepressant medication and stimulant medication at least 2 weeks prior to sleep testing.   Tracy Foley, MD, PhD

## 2021-03-12 NOTE — Patient Instructions (Signed)
Verbal instructions provided: Patient will call back when ready to schedule her sleep study. She understands, that her sleep study

## 2021-08-05 ENCOUNTER — Other Ambulatory Visit: Payer: Self-pay | Admitting: Urgent Care

## 2021-08-05 DIAGNOSIS — E063 Autoimmune thyroiditis: Secondary | ICD-10-CM

## 2021-08-07 ENCOUNTER — Ambulatory Visit
Admission: RE | Admit: 2021-08-07 | Discharge: 2021-08-07 | Disposition: A | Discharge status: Home / Self Care | Payer: BC Managed Care – PPO | Source: Ambulatory Visit | Attending: Urgent Care | Admitting: Urgent Care

## 2021-08-07 DIAGNOSIS — E063 Autoimmune thyroiditis: Secondary | ICD-10-CM

## 2022-02-15 IMAGING — US US THYROID
1 series · 14 of 25 positions shown · non-contrast
Comparison: None.

CLINICAL DATA: Other.  Autoimmune thyroiditis.

EXAM:
THYROID ULTRASOUND
TECHNIQUE: Ultrasound examination of the thyroid gland and adjacent soft
tissues was performed.

[Series 1: us thyroid · 0.04mm/px · 14 of 44 slices shown]
[im 1/44]
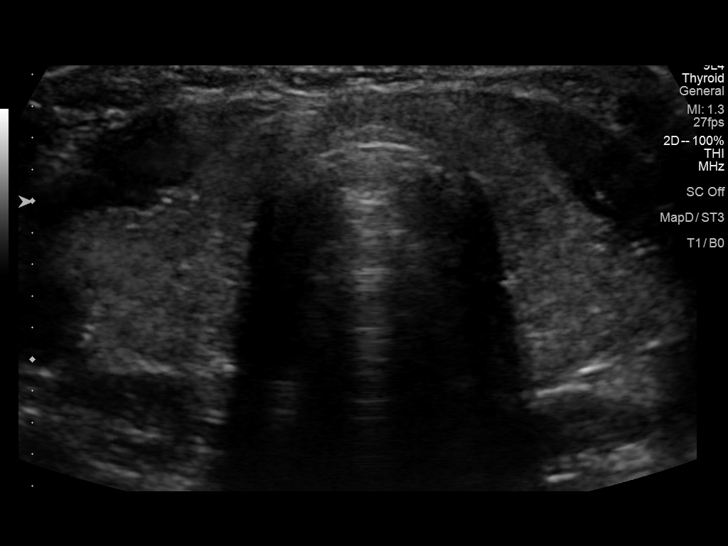
[im 4/44]
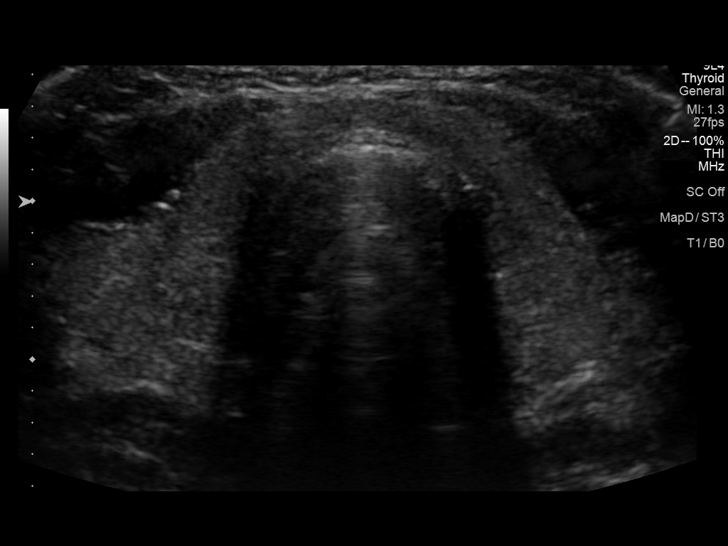
[im 8/44]
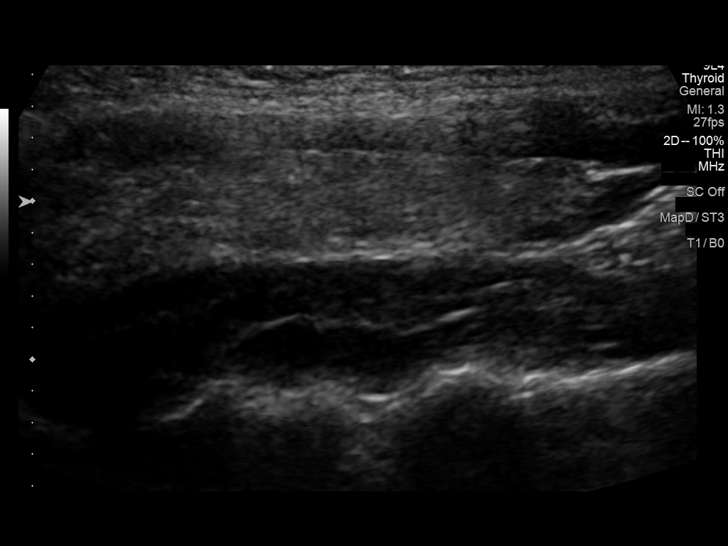
[im 11/44]
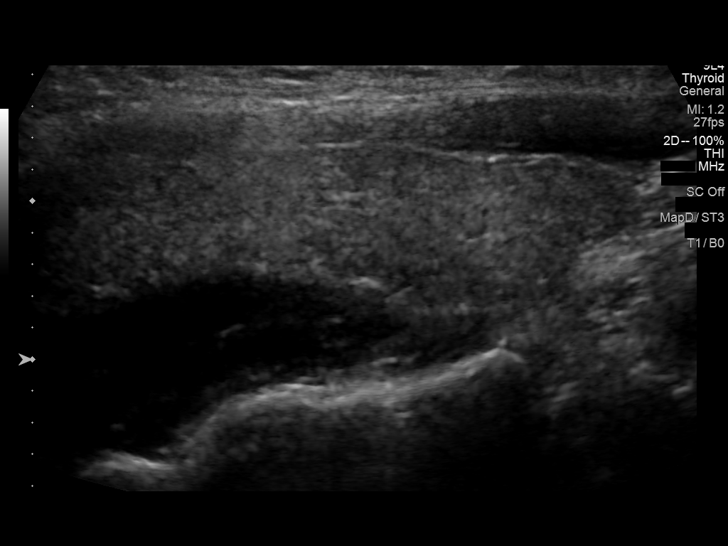
[im 15/44]
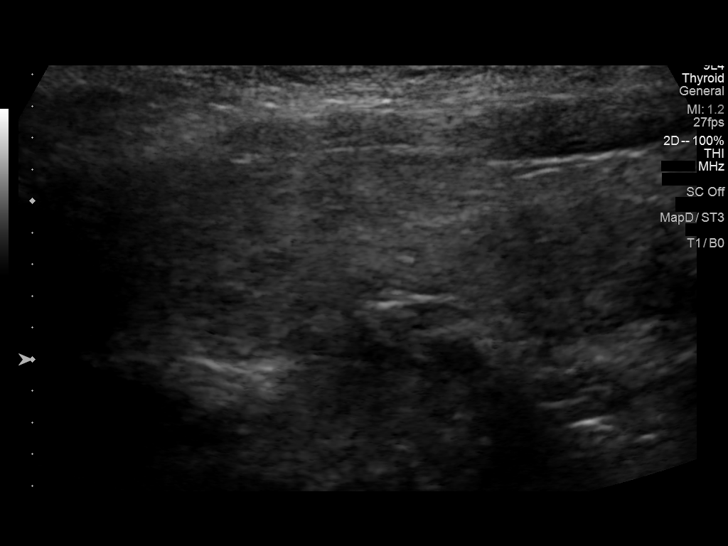
[im 17/44]
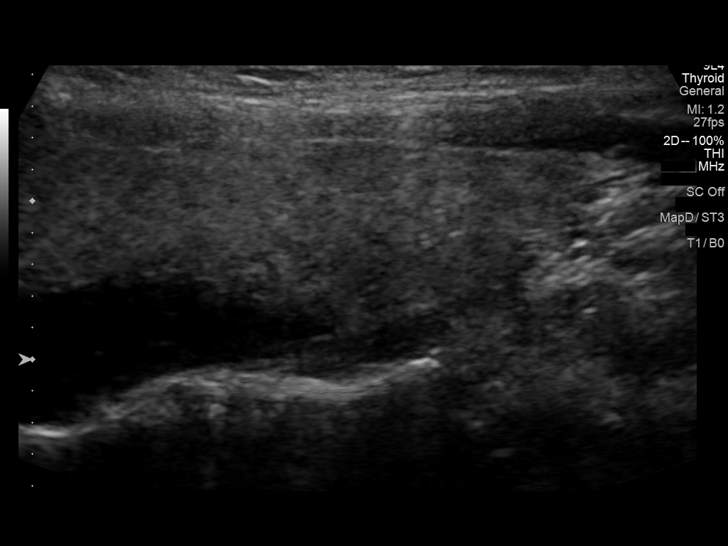
[im 20/44]
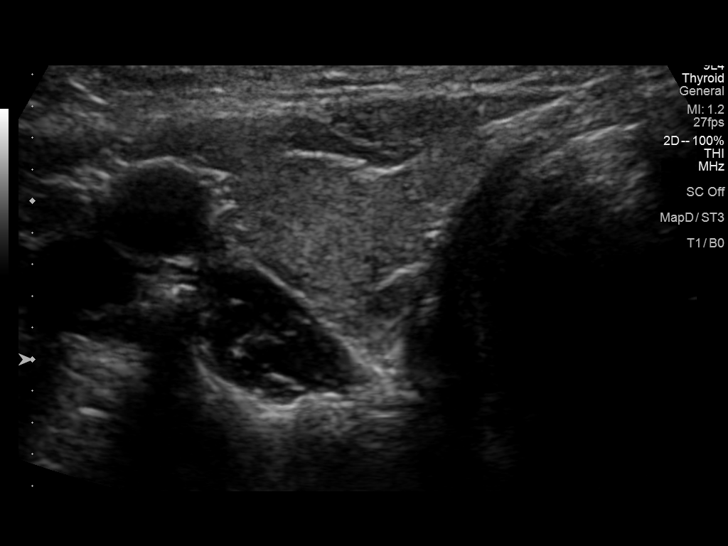
[im 24/44]
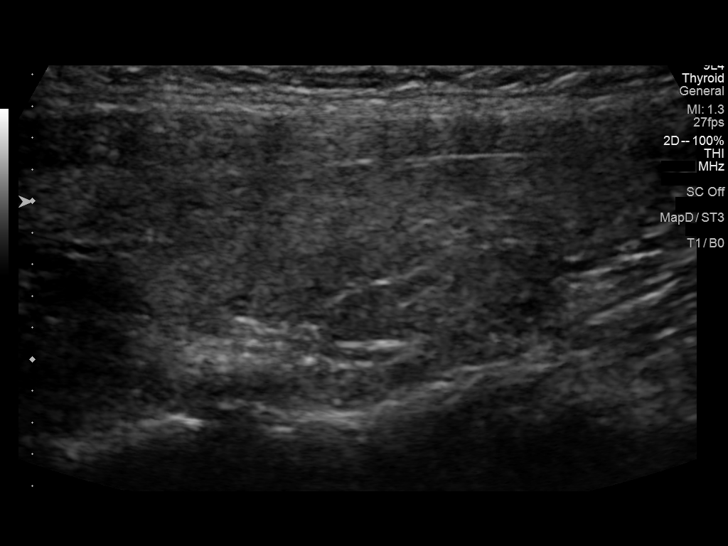
[im 27/44]
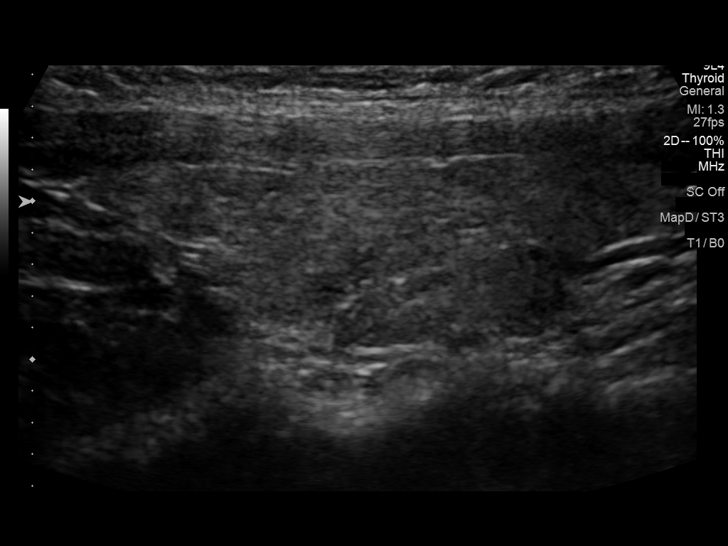
[im 29/44]
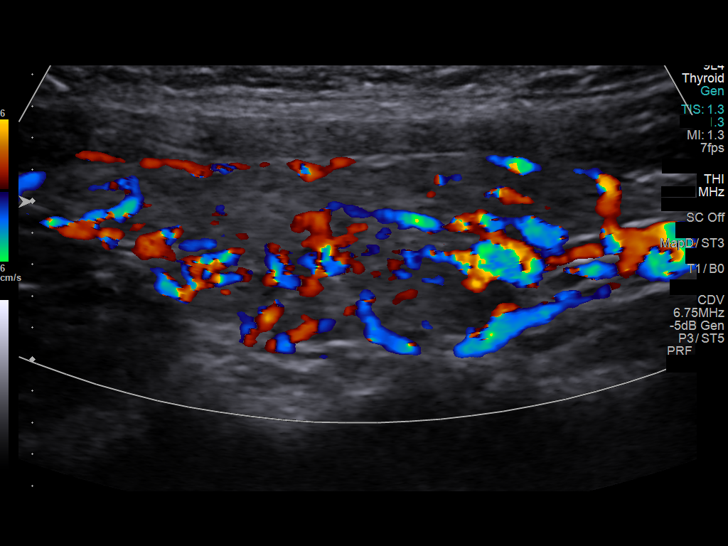
[im 33/44]
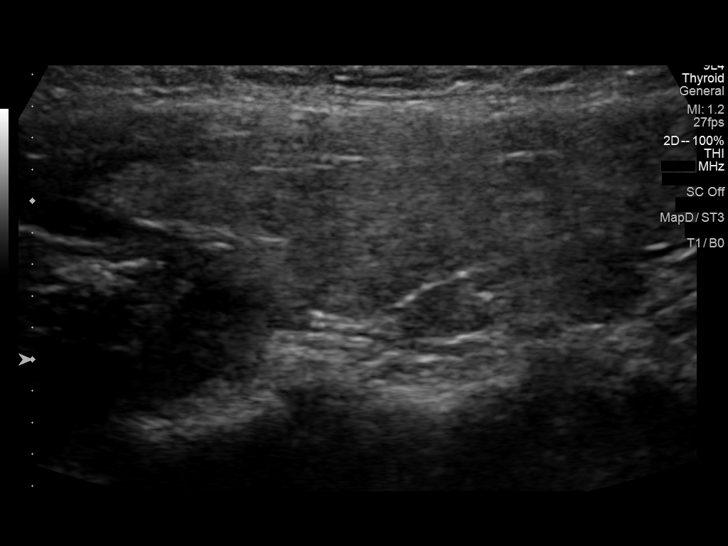
[im 36/44]
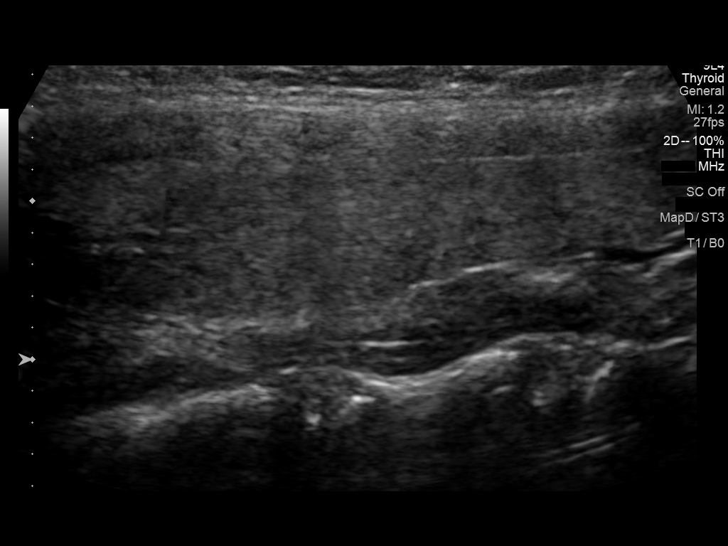
[im 40/44]
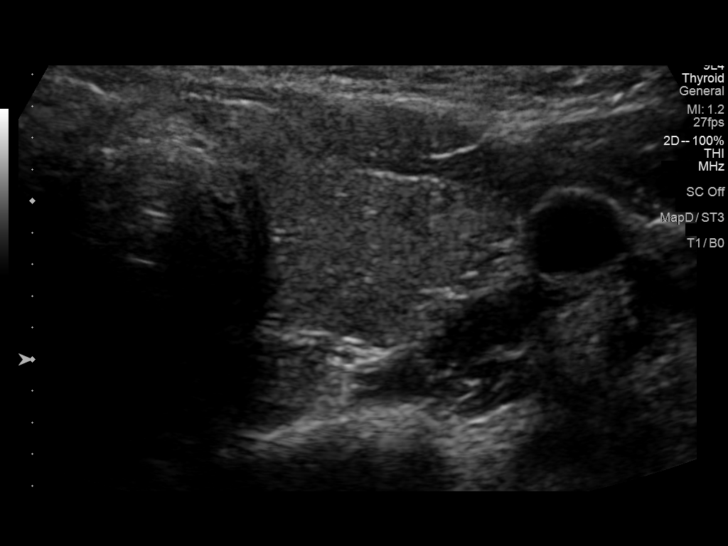
[im 44/44]
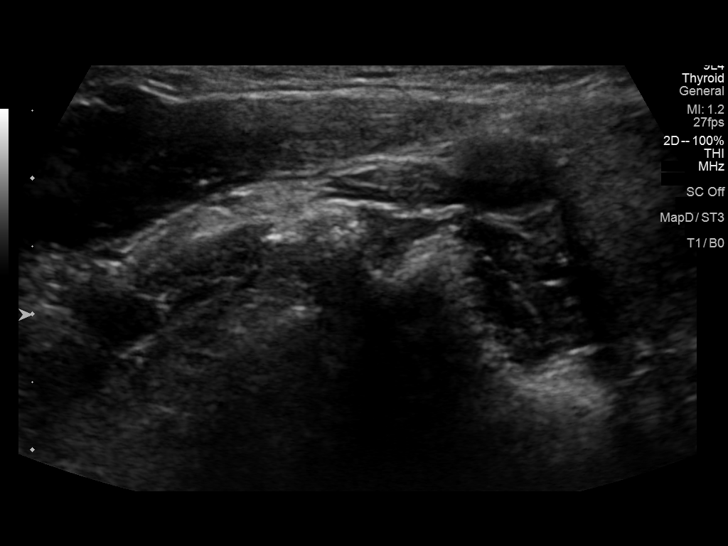

[14 of 25 positions shown; findings below may reference images not displayed]

FINDINGS: Parenchymal Echotexture: Moderately heterogenous - potential mild
diffuse glandular hyperemia (images 14 and 30)

Isthmus: Normal in size measuring 0.2 cm in diameter

Right lobe: Normal in size measuring 4.4 x 1.3 x 1.5 cm

Left lobe: Normal in size measuring 4.1 x 1.2 x 1.6 cm

_________________________________________________________

Estimated total number of nodules >/= 1 cm: 0

Number of spongiform nodules >/=  2 cm not described below (TR1): 0

Number of mixed cystic and solid nodules >/= 1.5 cm not described
below (TR2): 0

_________________________________________________________

No discrete nodules are seen within the thyroid gland.
IMPRESSION: Normal sized though moderately heterogeneous appearing and potential
hyperemic thyroid without discrete nodule or mass. Findings are
nonspecific though compatible with provided history of autoimmune
thyroiditis.
